# Patient Record
Sex: Male | Born: 1955 | Race: Black or African American | Hispanic: No | Marital: Single | Smoking: Never smoker
Health system: Southern US, Community
[De-identification: ages and names within clinical notes are randomized; demographics above are authoritative.]

## PROBLEM LIST (undated history)

## (undated) DIAGNOSIS — I10 Essential (primary) hypertension: Secondary | ICD-10-CM

## (undated) DIAGNOSIS — K4090 Unilateral inguinal hernia, without obstruction or gangrene, not specified as recurrent: Secondary | ICD-10-CM

## (undated) DIAGNOSIS — E876 Hypokalemia: Secondary | ICD-10-CM

## (undated) DIAGNOSIS — E7800 Pure hypercholesterolemia, unspecified: Secondary | ICD-10-CM

## (undated) DIAGNOSIS — N529 Male erectile dysfunction, unspecified: Secondary | ICD-10-CM

## (undated) DIAGNOSIS — N4340 Spermatocele of epididymis, unspecified: Secondary | ICD-10-CM

## (undated) DIAGNOSIS — J45909 Unspecified asthma, uncomplicated: Secondary | ICD-10-CM

## (undated) DIAGNOSIS — C801 Malignant (primary) neoplasm, unspecified: Secondary | ICD-10-CM

## (undated) DIAGNOSIS — N1370 Vesicoureteral-reflux, unspecified: Secondary | ICD-10-CM

## (undated) DIAGNOSIS — T7840XA Allergy, unspecified, initial encounter: Secondary | ICD-10-CM

## (undated) HISTORY — PX: HERNIA REPAIR: SHX51

## (undated) HISTORY — DX: Unilateral inguinal hernia, without obstruction or gangrene, not specified as recurrent: K40.90

## (undated) HISTORY — DX: Allergy, unspecified, initial encounter: T78.40XA

## (undated) HISTORY — DX: Hypokalemia: E87.6

## (undated) HISTORY — DX: Essential (primary) hypertension: I10

## (undated) HISTORY — DX: Unspecified asthma, uncomplicated: J45.909

## (undated) HISTORY — DX: Male erectile dysfunction, unspecified: N52.9

## (undated) HISTORY — DX: Spermatocele of epididymis, unspecified: N43.40

## (undated) HISTORY — DX: Pure hypercholesterolemia, unspecified: E78.00

---

## 2000-07-16 ENCOUNTER — Emergency Department (HOSPITAL_COMMUNITY): Admission: EM | Admit: 2000-07-16 | Discharge: 2000-07-17 | Payer: Self-pay | Admitting: Emergency Medicine

## 2000-07-17 ENCOUNTER — Encounter: Payer: Self-pay | Admitting: Emergency Medicine

## 2001-10-16 HISTORY — PX: UPPER GI ENDOSCOPY: SHX6162

## 2001-10-16 HISTORY — PX: ESOPHAGEAL DILATION: SHX303

## 2004-12-05 ENCOUNTER — Emergency Department (HOSPITAL_COMMUNITY): Admission: EM | Admit: 2004-12-05 | Discharge: 2004-12-05 | Payer: Self-pay | Admitting: Emergency Medicine

## 2006-08-16 ENCOUNTER — Ambulatory Visit (HOSPITAL_COMMUNITY): Admission: RE | Admit: 2006-08-16 | Discharge: 2006-08-16 | Payer: Self-pay | Admitting: Family Medicine

## 2006-10-31 ENCOUNTER — Emergency Department (HOSPITAL_COMMUNITY): Admission: EM | Admit: 2006-10-31 | Discharge: 2006-10-31 | Payer: Self-pay | Admitting: Emergency Medicine

## 2007-01-09 ENCOUNTER — Ambulatory Visit: Payer: Self-pay | Admitting: Internal Medicine

## 2007-01-09 ENCOUNTER — Inpatient Hospital Stay (HOSPITAL_COMMUNITY): Admission: RE | Admit: 2007-01-09 | Discharge: 2007-01-11 | Payer: Self-pay | Admitting: Internal Medicine

## 2007-01-15 ENCOUNTER — Ambulatory Visit: Payer: Self-pay | Admitting: Internal Medicine

## 2007-02-13 ENCOUNTER — Ambulatory Visit: Payer: Self-pay | Admitting: Internal Medicine

## 2007-02-26 ENCOUNTER — Ambulatory Visit: Payer: Self-pay | Admitting: Internal Medicine

## 2007-02-26 ENCOUNTER — Encounter: Payer: Self-pay | Admitting: Internal Medicine

## 2007-07-09 ENCOUNTER — Encounter: Admission: RE | Admit: 2007-07-09 | Discharge: 2007-07-09 | Payer: Self-pay | Admitting: Otolaryngology

## 2007-07-15 ENCOUNTER — Encounter (INDEPENDENT_AMBULATORY_CARE_PROVIDER_SITE_OTHER): Payer: Self-pay | Admitting: Otolaryngology

## 2007-07-15 ENCOUNTER — Ambulatory Visit (HOSPITAL_BASED_OUTPATIENT_CLINIC_OR_DEPARTMENT_OTHER): Admission: RE | Admit: 2007-07-15 | Discharge: 2007-07-15 | Payer: Self-pay | Admitting: Otolaryngology

## 2011-02-28 NOTE — Assessment & Plan Note (Signed)
[facility]                         GASTROENTEROLOGY OFFICE NOTE   FINLAY, GODBEE                     MRN:          [id number]  DATE:02/13/2007                            DOB:          [birth year]    Mr. [name] is a 55 year old gentleman who was recently hospitalized  after a food impaction in the esophagus with post procedure hypoxia,  possible aspiration and possible injury to the esophagus. He had a giant  piece of meat impacted in the esophagus during his emergency admission  on January 09, 2007. The impaction was removed. A similar episode happened  in 2001. The food was removed but the patient never returned for  followup. Since the food impaction was removed, the patient as been  chewing quite carefully and has had only occasionally problems with  swallowing liquids as well as solids. Barium swallow study before the  discharge on January 11, 2007 showed actually a normal esophagus, no  evidence of achalasia, no stricture. The barium tablet passed through  the esophagus without difficulty. There was a small hiatal hernia, no  tertiary contractions. The patient had a brief episode of dysphagia to  [place] fries several days ago. He flushed it down with a liquid but he  is interested in proceeding with dilatation which could not be done  during the emergency episode because of tearing of his esophagus and  maceration due to food impaction.   MEDICATIONS:  1. Potassium 10 mEq a day.  2. HCTZ 25 mg 1/2 tablet daily.   PHYSICAL EXAMINATION:  VITAL SIGNS:  Blood pressure 140/92, pulse 80 and  weight 175 pounds.  GENERAL:  He was alert and oriented in no distress. Voice was normal.  HEENT:  Oral cavity was normal.  NECK:  Supple with no adenopathy.  LUNGS:  Clear to auscultation. No wheezes or rales.  COR:  Quiet precordium, normal S1, normal S2.  ABDOMEN:  Soft, nontender with normal active bowel sounds.  RECTAL:  Not done.   IMPRESSION:   A 55 year old gentleman status post food impaction in the  distal esophagus. He was not dilated at the time of disimpaction. He had  some residual dysphagia mostly to solids possibly due to liquids. The  radiographic study of his esophagus on discharge did not show any  stricture or motility disorder. It also did not suggest achalasia.   PLAN:  1. Upper endoscopy with esophageal dilatation.  2. Consider Botox injection if we find hypertensive esophageal      sphincter.     [name]. Juanda Chance, MD  Electronically Signed    DMB/MedQ  DD: 02/13/2007  DT: 02/13/2007  Job #: [id number]   cc:   L. Lupe Carney, M.D.

## 2011-02-28 NOTE — Op Note (Signed)
[name], [name]            ACCOUNT NO.:  [id number]   MEDICAL RECORD NO.:  [id number]          PATIENT TYPE:  AMB   LOCATION:  DSC                          FACILITY:  [facility]   PHYSICIAN:  Karol T. Lazarus Salines, M.D. DATE OF BIRTH:  Jun 29, 1956   DATE OF PROCEDURE:  DATE OF DISCHARGE:                               OPERATIVE REPORT   PREOPERATIVE DIAGNOSES:  1. Bilateral polypoid chronic pansinusitis.  2. Nasal septal deviation.  3. Hypertrophic inferior turbinates with obstruction.   POSTOPERATIVE DIAGNOSES:  1. Bilateral polypoid chronic pansinusitis.  2. Nasal septal deviation.  3. Hypertrophic inferior turbinates with obstruction.   PROCEDURES PERFORMED:  1. Complete bilateral endoscopic ethmoidectomy.  2. Bilateral endoscopic antrostomy.  3. Bilateral endoscopic sphenoidotomy.  4. Bilateral endoscopic frontal sinusotomy.  5. Nasal septoplasty.  6. Bilateral submucous resection inferior turbinates.   SURGEON:  Gloris Manchester. Lazarus Salines, M.D.   ANESTHESIA:  General orotracheal.   ESTIMATED BLOOD LOSS:  Less than 25 mL.   COMPLICATIONS:  None.   FINDINGS:  1. Large polyps in middle meatus on both sides.  2. Polypoid changes in the mucosa diffusely.  3. No active infection.  4. No evidence of violation of the periorbita/lamina papyracea or the      fovea ethmoidalis.   PROCEDURE:  With the patient in a comfortable supine position, general  orotracheal anesthesia was induced without difficulty.  At an  appropriate level, the patient was placed in a slight head elevated  beach chair position.  A saline-moistened throat pack was placed.  Nasal vibrissae were trimmed.  Cocaine crystals, 200 mg total, were  applied on cotton carriers to the anterior ethmoid and sphenopalatine  ganglion regions on both sides.  Cocaine solution, 160 mg total, was  applied on 0.5 x 3-inch cottonoids on both sides of the septum.  Finally, 1% Xylocaine with 1:100,000 epinephrine, 10 mL total, was  infiltrated into the anterior floor of the nose on both sides, into the  membranous columella on both sides, into the nasal spine region, and  into the submucoperichondrial plane of the septum on both sides.  Several minutes were allowed for this to take effect.  Sterile  preparation and draping of  the mid face was accomplished in the  standard fashion.   The materials were removed from the nose and observed to be intact and  correct in number.  The findings were as described above.  A small  anterior left floor incision was generated and the floor tunnel elevated  posteriorly and carried medially to the vomer and brought forward.  On  the right side, a hemitransfixing incision was executed and carried down  to a floor incision.  The floor tunnel was elevated in the standard  fashion and carried medially and brought forward.  The  submucoperichondrial plane of the septum was elevated.  It was quite  adherent to the anterior inferior septum, probably consistent with  previous trauma.  It was elevated with a small perforation in this area,  but the perichondrium was preserved.  Working farther back, the  submucoperiosteal plane of the perpendicular plate was elevated up to  the dorsum and brought down to the floor of the nose and communicated  with the floor tunnel and this dissection was brought forward.   The chondroethmoidal junction was identified and opened with a Print production planner.  The opposite submucoperiosteal plane of the septum was  elevated.  The superior perpendicular plate was lysed with an open  Jansen-Middleton forceps and the anterior inferior portion of the  perpendicular plate was rocked free and delivered.  The dissection was  carried out under direct vision over the prominent chondroethmoid spur,  which was then rocked free and delivered.  Additional bony spicules as  they were deflected were also removed.   A 1.5-mm strip of the inferior quadrangular cartilage was  submucosally  resected and delivered.  This allowed the septum to trap door into the  midline, but otherwise the septum was in satisfactory position and  shape.  It was secured to the nasal spine with a figure-of-eight 4-0 PDS  suture.  The septal tunnels were carefully suctioned free.  The anterior  incisions were closed with interrupted 4-0 chromic suture.   After completing the nasoseptoplasty, under direct vision, a 25-gauge  spinal needle was used to infiltrate 1% Xylocaine with 1:100,000  epinephrine into the middle turbinate on each side and into the lateral  wall of the nose in the middle meatus, 6 mL total were used.  Additional  cocaine solution, 160 mg total, was applied on 0.5 x 3-inch cottonoids  and placed into the middle meatus and between the middle turbinate and  the septum.  Several additional minutes were allowed for hemostasis and  topical anesthesia to take effect.  The endoscopic instrumentation was  prepared.   Once again, the materials were removed from the nose and observed to be  intact and correct in number.  The findings were as described above.  The 0 degree nasal endoscope was introduced on the right side and again,  the findings were as described above.  The power debrider was used to  debulk the very bulky polyp in the middle meatus.  The specimen was  separated right from left.  After removing the large polyp, the uncinate  process was identified and incised at its base with a sickle knife.  It  was amputated inferiorly and then removed with the power debrider.  The  antrostomy was visible and was enlarged in a posterior direction with a  Gruenwald forceps and in the anterior direction with a backbiting  forceps.  The edges were cleaned with a power debrider.  Using the  antrostomy as a landmark for the laminar papyracea, and attempting to  preserve the horizontal lamella of the middle turbinate, the bulla  ethmoidalis was entered with a punch forceps  and then removed with a  debrider.  The vertical lamella of the middle turbinate was penetrated  low and medially with the punch forceps and dividing partitions were  carefully removed from inferiorly upward and bone and mucosa were then  removed with a debrider.  The InstaTrak apparatus had been oriented at  the beginning of the case and was at this point used to identify the  posterior ethmoid cells in the anterior face of the sphenoid.  The  sphenoid face was penetrated with a suction tip and then opened widely  with a power debrider and Hajek forceps.  Working from this point  forward, additional partitions up against the fovea ethmoidalis were  removed to clean the posterior and mid ethmoid block thoroughly.  The instruments were switched to a 30-degree allowing better  visualization of the anterior ethmoids/agger nasi region.  Cells were  broken down with a punched forceps and then removed with a punched  forceps and/or with a debrider.  Finally, an opening was identified,  which upon enlarging it was clearly the frontal sinus.  The InstaTrak  orientation identified such.  The curved olive tip suction was able to  be passed well up into the sinus, identifying the frontal sinus more  definitively.  Direct visualization was also possible after clearing  away the bony partition and removing loose mucosal shards.  At this  point, a total sinusectomy was felt to have been accomplished on the  right side.  Hemostasis was spontaneous.  The middle turbinate was  somewhat floppy but still had decent support.   Attention was turned to the left side, once again a large polyp was  debrided from the middle meatus.  The uncinate process was identified,  incised, and removed with a debrider.  The bulla ethmoidalis was  identified, opened and reduced with a debrider.  The antrostomy was  identified and opened anteriorly and posteriorly and the cut edges were  cleaned with a debrider.  The  vertical lamella of the middle turbinate  was identified and penetrated low and medial and the posterior ethmoid  cells were entered and debrided in the standard fashion.  Finally, the  space of the sphenoid was identified using the InstaTrak and was opened  and enlarged using the debrider and the Hajek forceps.   Entering the sphenoethmoidal recess medial to the middle turbinate, some  redundant mucosa was identified and this was removed with a power  debrider to allow wide open sphenoidotomy.   Working towards the anterior ethmoid cells and the agger nasi.  The 30-  degree nasal endoscope was used and once again cells were broken down  and debrided.  A rather small frontal entrance was identified and  designated with the InstaTrak and it was opened up against the bony  beak.  Bony chips and mucosal shards were removed with the debrider.  This completed the left sinusotomy.   Returning to the right side and working medial to the middle turbinate  into the sphenoethmoidal recess, a small amount of redundant mucosa was  removed with a power debrider.  At this point, both sinus cavities were  inspected and there was no evidence of spinal fluid leak and no evidence  of violation of the lamina papyracea on either side.   Just before completing the sinus procedure, the inferior turbinates were  infiltrated with 4 mL of 1% Xylocaine with 1:100,000 epinephrine.  Several minutes were allowed for this to take effect.   Beginning on the right side, the anterior hood of the inferior turbinate  was sharply incised just behind the nasal valve.  The medial mucosa of  the middle turbinate was incised in an anterior upsloping fashion and a  laterally based flap was developed.  The turbinate was then fractured.  Using angled turbinate scissors, the turbinate bone and lateral mucosa  were submucosally resected in a posterior downsloping fashion taking  virtually all of the anterior pole and leaving  virtually all of the  posterior pole.  Bony spicules were removed for more rapid healing. The  bulbous posterior pole and the cut mucosal edges were suctioned and  coagulated for hemostasis.  The turbinate was out fractured and this  side was completed.  The left side was done  in identical fashion.   At this point, 0.040 reinforced Silastic splints were placed against the  septum on both sides, tall enough to prevent the middle turbinates from  adhering to the septum.  These were secured to the septum with a 3-0  Ethilon anteriorly.   Triple thickness Telfa packs with a soaked tag and neosporin  impregnation were placed up into the ethmoid block on both sides and  nontagged larger triple thickness Telfa packs were placed low in the  nose between the turbinate and the septum for hemostasis.  At this  point, the procedure was completed.  The pharynx was suctioned clean and  the throat pack was removed.  The patient was returned to anesthesia,  awakened, extubated, and transferred to recovery in stable condition.   COMMENT:  A 55 year old black male with chronic polypoid pansinusitis  and asthma, no aspirin sensitivity, failing multiple courses of  antibiotics and steroids with the indication for today's procedure.  Anticipated routine  postoperative recovery with attention to ice, elevation, analgesia,  antibiosis, prednisone taper.  We will bring him back in the office in 1  day for pack removal and in 10 days for septal splint removal.  Given  low anticipated risk of postanesthetic or postsurgical complications,  feel an outpatient venue is appropriate.      Gloris Manchester. Lazarus Salines, M.D.  Electronically Signed     KTW/MEDQ  D:  07/15/2007  T:  07/15/2007  Job:  [id number]   cc:   Zola Button T. Lazarus Salines, M.D.  Elsworth Soho, M.D.

## 2011-03-03 NOTE — Discharge Summary (Signed)
NAMEKNOX, CERVI NO.:  [id number]   MEDICAL RECORD NO.:  [id number]          PATIENT TYPE:  INP   LOCATION:  5017                         FACILITY:  [facility]   PHYSICIAN:  Ireland Virrueta doctor         DATE OF BIRTH:  16-Jan-1956   DATE OF ADMISSION:  01/09/2007  DATE OF DISCHARGE:  01/11/2007                               DISCHARGE SUMMARY   ADMITTING DIAGNOSIS:  1. Food impaction in 2001.  2. Asthma.  3. Hypertension  4. Acute respiratory distress following removal of giant food      impaction, question sedation -induced hypoxemia, question      aspiration.      a.     Rule out Boerhaave syndrome, rule out esophageal tear       secondary to traumatic removal of food impaction, rule out       esophageal stricture.  5. Question alcohol abuse.  6. Rule out aspiration pneumonia.   DISCHARGE DIAGNOSES:  1. Status post removal of giant food impaction.  2. Hypoxia occurred following the removal of the steak.  3. Hypoxia, probable aspiration during the endoscopic procedure.  4. Question of portal venous gas.   Initial CT showed question of portal venous gas and clear lungs.  Chest  CT did show some scattered airspace disease in the left upper and lower  lobes.  Followup chest CT showed improving left lung airspace disease.  The patient may have pneumonitis but not suspected of having pneumonia  as he is afebrile and has had no fevers or white blood cell count  elevation.   PROCEDURES:  Upper endoscopy with removal of food impaction by Dr. Lina Sar on January 09, 2007.   CONSULTATIONS:  Dr. Tyson Alias from critical care medicine.   BRIEF HISTORY:  [name] is a 56 year old African American  gentleman who has a history of food impaction in 2001.  He is not  chronically taking any proton pump inhibitors and generally does not  suffer from dysphagia, except for on occasion when he chokes on large  pieces of food, especially meats.  This has happened only a  few times  over several years.  The patient was eating early in the morning of  January 09, 2007, and swallowed a piece of steak which became impacted and  the patient was unable to swallow any other material after this.  He  developed dry heaves and went to Urgent Care.  He was sent by urgent  care to see Dr. Juanda Chance at the endoscopy unit at [facility].   At endoscopy, a very large 10 x 4-cm piece of steak was impacted in what  appeared to be a stricture and ulcerated distal esophagus.  This was  removed with a Designer, jewellery.  And as soon as this was extracted, the  patient's oxygen saturation dropped to 81% and stayed down despite the  use of 100% face mask oxygen.  There was a concern that he had aspirated  and as he was unstable from a respiratory standpoint, he was admitted to  the ICU  and critical care  input was sought.   LABORATORY:  Initial pH was 7.49 with pCO2 of 30.9, pO2 of 119, bicarb  of 23.6, tCO2 of 25.  White blood cell count was 8.1, on recheck it was  5.5.  Hemoglobin went from 14.2 to 12.5.  Hematocrit went from 41.6 to  36.6.  MCV 94, platelet count ranging 187 to 235.  PT 13.4, INR 1, PTT  25.  Sodium 139, potassium 3.5, chloride 108, CO2 26, glucose 125, BUN  11, creatinine 0.95.  Total bilirubin was 0.7, alkaline phosphatase 63,  AST 19, ALT 15.  Albumin 3.5.  Amylase 118, lipase 28.  Calcium 8.6.  TSH 0.811.  The hemoglobin at discharge was 12.8 with hematocrit of  37.7.   IMAGING STUDIES:  Initial chest x-ray was negative for free air or  pneumomediastinum.  Lungs were clear.  Question of portal venous gas.  CT of chest, abdomen, and pelvis of January 09, 2007, showed scattered air  space opacities somewhat nodular in nature in the left upper and left  lower lobe, likely representing aspiration pneumonitis or pneumonia.  Portal venous gas was noted with no clear upper abdominal cause.  Followup two-view chest of January 11, 2007, showed improving left  lung  airspace disease and the lucencies in the right upper quadrant  previously representing the portal venous gas were not seen on the  followup chest x-ray.  A follow-up chest x-ray failed to confirm  presence of portal venous gas seen previously on first chest x-ray and  chest and abdominal CT scan.  An esophagram was obtained and this showed  a small hiatal hernia, mild gastroesophageal reflux disease, and did not  show any stricture.   HOSPITAL COURSE:  The patient was admitted overnight to the ICU and  closely monitored.  Within a couple of hours of the incident involving  hypoxemia in the endoscopy unit, the patient was speaking clearly, not  coughing, and not in any kind of respiratory distress.  He spent an  uneventful night in the critical care unit and was transferred to the  floor the following day.   Initially, the chest x-ray was negative but a chest CT scan done to rule  out any possible tear of the esophagus and also to workup the question  of portal venous gas seen on the initial chest x-ray, was notable for  lucencies they felt were consistent with portal venous gas and some  developing left lung airspace disease.  The patient had been empirically  started on Diflucan and Zosyn.  He did not have any leukocytosis on  repeat labs.  He was afebrile.  He was not coughing.  He was not  hypoxemic, and ultimately it was felt that the patient could stop the  antibiotics.   The question of portal venous gas was worked up with the chest CT and  followup chest x-ray.  The followup chest x-ray failed to confirm the  presence of this gas.  In any event, the patient was in no way toxic or  sick enough during his hospital stay to support a diagnosis of portal  venous gas, and this was probably a transient finding, perhaps related  to the endoscopy.  There was no evidence for any esophageal perforation  or extravasation of contrast into the mediastinum.  The patient's diet was  advanced from clear liquids through a mechanical  soft diet.  He was admonished on at least a couple of occasions to be  very careful and chew  his food well, and to avoid swallowing large  pieces of meat or breads.   The patient was discharged in stable and much improved condition to  home.  He does have an appointment to see Dr. Juanda Chance back in the office  for a visit on February 13, 2007.  At that time it can be decided whether  or not he should have repeat endoscopy.   The patient did undergo an upper GI study on the day of his discharge  and this failed to confirm a stricture.  It did show hiatal hernia and  some reflux disease.   MEDICATIONS AT DISCHARGE:  1. Omeprazole 20 mg once daily.  2. Hydrochlorothiazide 12.5 mg once daily.   The patient was advised that if he had fever, felt particularly unwell,  or was having extensive cough, or shortness of breath that he should  call our office so that he could get a prescription for antibiotics but  at this point it is not felt that he has a pneumonia and it is not felt  that he requires an oral course of antibiotics.      Ruben Moccasin, PA-C    ______________________________  Iyania Denne doctor    SG/MEDQ  D:  01/11/2007  T:  01/11/2007  Job:  [id number]   cc:   Hedwig Morton. Juanda Chance, MD  L. Lupe Carney, M.D.

## 2011-03-03 NOTE — H&P (Signed)
NAMEHEATH, [name] NO.:  [id number]   MEDICAL RECORD NO.:  [id number]          PATIENT TYPE:  INP   LOCATION:  2113                         FACILITY:  [facility]   PHYSICIAN:  Hedwig Morton. Juanda Chance, MD     DATE OF BIRTH:  [birth year]   DATE OF ADMISSION:  01/09/2007  DATE OF DISCHARGE:                              HISTORY & PHYSICAL   REASON FOR ADMISSION:  Hypoxemia and possible aspiration following EGD  with removal of food impaction earlier today.   HISTORY OF PRESENT ILLNESS:  Mr. [name] is a 55 year old African  American male.  In 2001 he had a food impaction which required  endoscopic disimpaction.  He is not on any PPI. He does not generally  have a lot of dysphasia but does suffer this on occasion.  Last evening  he was out partying and he had probably about 4 beers.  He picked up  some take out food which included stake, on the way home.  At 4 o'clock  this morning he ate the steak.  The first bite went down fine, but the  second bite got impacted and he became unable to swallow other material  after this.  He did a little bit of vomiting but mostly dry heaving.  He  went to urgent care and the doctor there contacted Dr. Juanda Chance for  assistance.  He came over for upper endoscopy to the [facility].   At endoscopy a very large steak was impacted in a strictured and  ulcerated distal esophagus.  She was able to retrieve this piece of  steak which measured approximately 10 x 4 cm with a Designer, jewellery.  Immediately after this was extracted the patient's oxygen saturation  dropped to 81% and stayed down despite use of 100% face mask oxygen.  Critical care was contacted and the patient was admitted to ICU for  observation and rule out of aspiration pneumonia.   ALLERGIES:  None.   MEDICATIONS:  Hydrochlorothiazide 12.5 mg daily.   PAST MEDICAL HISTORY:  Food impaction in 2001.  Asthma and also  hypertension.   SOCIAL HISTORY:  The  patient is single.  He lives in [place].  He  does not smoke. He drinks he says about a six-pack of beer a week but he  did have four beers yesterday evening.   FAMILY HISTORY:  Mother had diabetes mellitus type 2, thyroid disease,  adrenal disease, cataracts and ultimately she died of a brain tumor.  A  sister has had osteoarthritis and bone spurs and a ruptured disk and  rotator cuff tear.   REVIEW OF SYSTEMS:  UROLOGIC:  No prostate symptoms.  No dysuria, no  frequency.  NEUROLOGIC: No headaches.  No dizziness, no double vision.  PULMONARY:  Occasionally requires use of inhaler.  CARDIAC:  Does not  have palpitations, chest pain or lower extremity edema.  Pulses are  pending.  DERMATOLOGIC:  No history of skin cancer.  No rash.  No  itching.  Otherwise the full review of systems is negative for this  patient.   LABORATORY:  White  blood cell count 8.1.  Hemoglobin 14.2, hematocrit  41.6.  MCV 94.  Platelets 235,000.  PT 13.4, INR 1.0, PTT 25.  Sodium  139, potassium 3.5.  Chloride 108, CO2 26.  BUN 11, creatinine 0.9.  Glucose 125.  Total bilirubin, alkaline phosphatase, AST, ALT, albumin,  calcium all within normal limits.  The GFR is greater than 60.   Chest x-ray is pending.   PHYSICAL EXAMINATION:  VITAL SIGNS: Blood pressure 154/106, pulse 101,  oxygen saturation 94%.  Respirations 23.  GENERAL:  The patient is a well-appearing, pleasant white male. He  denies any discomfort or pain.  HEENT: Exam sclerae anicteric.  Conjunctivae is pink.  Oropharynx is  moist and clear.  NECK: Notable for no crepitations, no mass, no thyromegaly.  PULMONARY:  Positive for rales and expiratory wheezes bilaterally.  CARDIOVASCULAR:  There is a rapid S1-S2 with no murmurs, rubs or  gallops.  ABDOMEN:  Soft, nontender, nondistended, obese, no hepatosplenomegaly,  no bruits.  RECTAL:  GU:  Deferred.  EXTREMITIES:  No cyanosis, clubbing or edema.  NEUROLOGIC:  He is alert and oriented x3,  pleasant and cooperative.  PSYCHIATRIC:  Does not appear depressed.   IMPRESSION:  1. Acute respiratory distress following removal of a giant food      impaction.  Question sedation hypoxia, question aspiration.  Rule      out Boerhaave syndrome which is tearing of the esophagus secondary      to retching, rule out esophageal tear secondary to the traumatic      removal of food impaction.  2. Esophageal stricture.  This will require attention in the long-term      with attempts at dilation of the esophagus in a few weeks.  3. Question of alcohol abuse.  The patient currently denies but he      certainly did consume a fair amount of beer last night.   PLAN:  Critical care has been called for assistance.  We will plan to  get chest x-ray.  Continue oxygen.  CT of chest ordered, will begin  empiric antibiotics for coverage of aspiration pneumonia.      Jennye Moccasin, PA-C      Hedwig Morton. Juanda Chance, MD  Electronically Signed    SG/MEDQ  D:  01/09/2007  T:  01/10/2007  Job:  [id number]   cc:   L. Lupe Carney, M.D.

## 2011-03-03 NOTE — Procedures (Signed)
[place]  Patient:    BOLIVAR, KORANDA                     MRN: [id number] Adm. Date:  [id number] Attending:  Sandi Raveling CC:         Hedwig Morton. Juanda Chance, M.D. [facility]   Procedure Report  PROCEDURE:  Upper endoscopy and removal of foreign body.  INDICATION:  This 55 year old black male presented to the emergency room with acute food impaction which occurred about eight hours previously while eating meat.  The patient was unable to swallow his saliva and was expectorating all secretions.  Attempt for barium swallow in the emergency room was unsuccessful because of complete obstruction.  The patient was undergoing upper endoscopy for the attempt for removal of foreign.  INSTRUMENTS:  Fujinon single-channel endoscope.  PREMEDICATION:  Versed 10 mg IV, Demerol 100 mg IV.  DESCRIPTION OF PROCEDURE:  The Fujinon single-channel endoscope passed directly through the posterior pharynx into the esophagus.  The patient was monitored by pulse oximeter.  His oxygen saturations were normal.  The oral tube was inserted with help of 42 [place] Maloney dilator.  The endoscope was advanced into the esophagus which was full of secretions.  A large amount of liquid residual was aspirated from the mid and proximal esophagus.  There was a large food impaction of the distal esophagus which was firmly impacted in the distal esophagus and could not be pushed through.  A basket snare was placed over the impaction and the impaction was dislodged and put slightly back, but it was too large to go through the over tube.  It was piecemealed with a red tooth and finally endoscope was able to slide by the fragmented impaction and into the esophagus.  The mucosa of the distal esophagus appeared macerated with large runny erosions secondary to previous reflux esophagitis.  There was a mild stricture which was bleeding from this orifice in the distal esophagus.  There was also  edema in the distal esophagus.  Stomach was insufflated with air and showed a normal gastric mucosa, pyloric outlet and duodenum.  Endoscope was retroflexed and there was some bleeding from the GE junction as a result of the food impaction and maceration.  The patient tolerated the procedure well.  IMPRESSION: 1. Esophageal impaction, mid-impaction, status post disimpaction. 2. Esophagitis. 3. Esophageal stricture.  PLAN:  Protonix 40 mg q.d.  Liquid diet for 24 hours.  Office visit in three weeks.  The patient will need upper endoscopy and redilatation of the stricture when the esophagitis heals up. DD:  07/17/00 TD:  07/17/00 Job: [id number]

## 2011-07-27 LAB — BASIC METABOLIC PANEL
BUN: 15
CO2: 27
Calcium: 9
Chloride: 106
Creatinine, Ser: 1.05
GFR calc Af Amer: >60
GFR calc non Af Amer: >60
Glucose, Bld: 118 — ABNORMAL HIGH
Potassium: 3.9
Sodium: 139

## 2011-07-27 LAB — POCT HEMOGLOBIN-HEMACUE
Hemoglobin: 13.7
Operator id: 116011

## 2012-04-04 ENCOUNTER — Other Ambulatory Visit: Payer: Self-pay | Admitting: Family Medicine

## 2012-04-04 ENCOUNTER — Ambulatory Visit
Admission: RE | Admit: 2012-04-04 | Discharge: 2012-04-04 | Disposition: A | Discharge status: Home / Self Care | Payer: Managed Care, Other (non HMO) | Source: Ambulatory Visit | Attending: Family Medicine | Admitting: Family Medicine

## 2012-04-04 DIAGNOSIS — M79609 Pain in unspecified limb: Secondary | ICD-10-CM

## 2012-05-01 ENCOUNTER — Encounter (INDEPENDENT_AMBULATORY_CARE_PROVIDER_SITE_OTHER): Payer: Self-pay | Admitting: General Surgery

## 2012-05-01 ENCOUNTER — Ambulatory Visit (INDEPENDENT_AMBULATORY_CARE_PROVIDER_SITE_OTHER): Payer: Managed Care, Other (non HMO) | Admitting: General Surgery

## 2012-05-01 VITALS — BP 164/100 | HR 71 | Temp 97.4°F | Resp 16 | Ht 66.0 in | Wt 173.2 lb

## 2012-05-01 DIAGNOSIS — K4090 Unilateral inguinal hernia, without obstruction or gangrene, not specified as recurrent: Secondary | ICD-10-CM

## 2012-05-01 NOTE — Progress Notes (Signed)
Patient ID: [name], male   DOB: 1956-05-27, 56 y.o.   MRN: [id number]  No chief complaint on file.   HPI [name] is a 56 y.o. male.  This patient is referred by Dr. Clovis Riley for evaluation of a right inguinal hernia. 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 He said he first noticed this after lifting a heavy box several months ago where he noticed some testicular pain but this resolved. About 2 months ago he first noticed a bulge in the area but he says since then it has been decreasing in size although he still notices a bulge in the area. He denies any symptoms from this currently and states that his bowels are okay without any obstructive symptoms such as nausea vomiting. He denies any blood in the stool or melena. HPI  Past Medical History  Diagnosis Date  . Hypertension   . Asthma   . Erectile dysfunction     No past surgical history on file.  No family history on file.  Social History History  Substance Use Topics  . Smoking status: Never Smoker   . Smokeless tobacco: Never Used  . Alcohol Use: 0.5 oz/week    1 drink(s) per week    Allergies  Allergen Reactions  . Lipitor (Atorvastatin) Other (See Comments)    Muscle aches    Current Outpatient Prescriptions  Medication Sig Dispense Refill  . albuterol (PROVENTIL) (2.5 MG/3ML) 0.083% nebulizer solution Take 2.5 mg by nebulization every 6 (six) hours as needed.      . cetirizine (ZYRTEC) 10 MG tablet Take 10 mg by mouth daily.      . fluticasone (FLOVENT HFA) 110 MCG/ACT inhaler Inhale 1 puff into the lungs 2 (two) times daily.      . hydrochlorothiazide (HYDRODIURIL) 25 MG tablet Take 25 mg by mouth daily.      . potassium chloride (K-DUR) 10 MEQ tablet Take 10 mEq by mouth 2 (two) times daily.      . rosuvastatin (CRESTOR) 10 MG tablet Take 10 mg by mouth daily.      . sildenafil (VIAGRA) 100 MG tablet Take 100 mg by mouth daily as needed.        Review of  Systems Review of Systems All other review of systems negative or noncontributory except as stated in the HPI  Blood pressure 164/100, pulse 71, temperature 97.4 F (36.3 C), temperature source Temporal, resp. rate 16, height 5\' 6"  (1.676 m), weight 173 lb 3.2 oz (78.563 kg).  Physical Exam Physical Exam Physical Exam  Vitals reviewed. Constitutional: He is oriented to person, place, and time. He appears well-developed and well-nourished. No distress.  HENT:  Head: Normocephalic and atraumatic.  Mouth/Throat: No oropharyngeal exudate.  Eyes: Conjunctivae and EOM are normal. Pupils are equal, round, and reactive to light. Right eye exhibits no discharge. Left eye exhibits no discharge. No scleral icterus.  Neck: Normal range of motion. No tracheal deviation present.  Cardiovascular: Normal rate, regular rhythm and normal heart sounds.   Pulmonary/Chest: Effort normal and breath sounds normal. No stridor. No respiratory distress. He has no wheezes. He has no rales. He exhibits no tenderness.  Abdominal: Soft. Bowel sounds are normal. He exhibits no distension and no mass. There is no tenderness. There is no rebound and no guarding. He does have a small reducible right inguinal hernia and a noticeable bulge but again this is reducible. I do not appreciate any hernia on  the left. I do not appreciate any testicular problems or masses Musculoskeletal: Normal range of motion. He exhibits no edema and no tenderness.  Neurological: He is alert and oriented to person, place, and time.  Skin: Skin is warm and dry. No rash noted. He is not diaphoretic. No erythema. No pallor.  Psychiatric: He has a normal mood and affect. His behavior is normal. Judgment and thought content normal.    Data Reviewed   Assessment    Reducible right inguinal hernia I agree that he does have a reducible right inguinal hernia on exam and by history. I discussed with him the options for watchful waiting versus surgical  repair and I have recommended surgical repair. We discussed the surgical options of laparoscopic and open repair. Because he is currently asymptomatic from this, he would like to discuss repair with his supervisor and his job so he can schedule time for elective surgery. I discussed with him the signs and symptoms of incarceration and strangulation and recommended that if indeed these occur he presented to the emergency room for immediate evaluation.    Plan    I gave him some information to review about inguinal hernias and he will work with his work in order to schedule a time for elective right inguinal hernia repair with mesh. He will call me back when he would like to schedule this.       Lodema Pilot DAVID 05/01/2012, 2:06 PM

## 2012-08-22 ENCOUNTER — Encounter (INDEPENDENT_AMBULATORY_CARE_PROVIDER_SITE_OTHER): Payer: Self-pay | Admitting: General Surgery

## 2012-08-22 ENCOUNTER — Ambulatory Visit (INDEPENDENT_AMBULATORY_CARE_PROVIDER_SITE_OTHER): Payer: Managed Care, Other (non HMO) | Admitting: General Surgery

## 2012-08-22 VITALS — BP 158/82 | HR 72 | Temp 97.0°F | Resp 12 | Ht 66.0 in | Wt 173.6 lb

## 2012-08-22 DIAGNOSIS — K4090 Unilateral inguinal hernia, without obstruction or gangrene, not specified as recurrent: Secondary | ICD-10-CM

## 2012-08-22 NOTE — Progress Notes (Signed)
Patient ID: [name], male   DOB: [birth year], 56 y.o.   MRN: [id number]  Chief Complaint  Patient presents with  . Pre-op Exam    reck hernia / ready to schedule Sx    HPI [name] is a 56 y.o. male. This patient is known to me for prior evaluation of amildly symptomatic right inguinal hernia. He does do some lifting and driving for work and it occasionally causes him discomfort. It is not currently bothering him but he is now working with his work to arrange for some time off to undergo surgery. He says it has not really changed much since our last visit. he has no obstructive symptoms.HPI  Past Medical History  Diagnosis Date  . Hypertension   . Asthma   . Erectile dysfunction   . Spermatocele   . Impotence of organic origin   . Pure hypercholesterolemia   . Allergy   . Hypopotassemia   . Inguinal hernia without mention of obstruction or gangrene, unilateral or unspecified, (not specified as recurrent)     History reviewed. No pertinent past surgical history.  History reviewed. No pertinent family history.  Social History History  Substance Use Topics  . Smoking status: Never Smoker   . Smokeless tobacco: Never Used  . Alcohol Use: 0.5 oz/week    1 drink(s) per week    Allergies  Allergen Reactions  . Lipitor (Atorvastatin) Other (See Comments)    Muscle aches    Current Outpatient Prescriptions  Medication Sig Dispense Refill  . albuterol (PROVENTIL) (2.5 MG/3ML) 0.083% nebulizer solution Take 2.5 mg by nebulization every 6 (six) hours as needed.      . cetirizine (ZYRTEC) 10 MG tablet Take 10 mg by mouth daily.      . fluticasone (FLOVENT HFA) 110 MCG/ACT inhaler Inhale 1 puff into the lungs 2 (two) times daily.      . hydrochlorothiazide (HYDRODIURIL) 25 MG tablet Take 25 mg by mouth daily.      Marland Kitchen KLOR-CON M10 10 MEQ tablet       . potassium chloride (K-DUR) 10 MEQ tablet Take 10 mEq by mouth 2 (two) times daily.      . sildenafil (VIAGRA) 100 MG  tablet Take 100 mg by mouth daily as needed.        Review of Systems Review of Systems All other review of systems negative or noncontributory except as stated in the HPI  Blood pressure 158/82, pulse 72, temperature 97 F (36.1 C), temperature source Temporal, resp. rate 12, height 5\' 6"  (1.676 m), weight 173 lb 9.6 oz (78.744 kg).  Physical Exam Physical Exam Physical Exam  Vitals reviewed. Constitutional: He is oriented to person, place, and time. He appears well-developed and well-nourished. No distress.  HENT:  Head: Normocephalic and atraumatic.  Mouth/Throat: No oropharyngeal exudate.  Eyes: Conjunctivae and EOM are normal. Pupils are equal, round, and reactive to light. Right eye exhibits no discharge. Left eye exhibits no discharge. No scleral icterus.  Neck: Normal range of motion. No tracheal deviation present.  Cardiovascular: Normal rate, regular rhythm and normal heart sounds.   Pulmonary/Chest: Effort normal and breath sounds normal. No stridor. No respiratory distress. He has no wheezes. He has no rales. He exhibits no tenderness.  Abdominal: Soft. Bowel sounds are normal. He exhibits no distension and no mass. There is no tenderness. There is no rebound and no guarding. reducible right inguinal hernia. No left inguinal hernia. Musculoskeletal: Normal range of motion. He exhibits no edema and  no tenderness.  Neurological: He is alert and oriented to person, place, and time.  Skin: Skin is warm and dry. No rash noted. He is not diaphoretic. No erythema. No pallor.  Psychiatric: He has a normal mood and affect. His behavior is normal. Judgment and thought content normal.    Data Reviewed   Assessment    Reducible right inguinal hernia We discussed the options of continued watchful waiting versus open or laparoscopic repair. We discussed the pros and cons of each and the risks and benefits of the procedure. We discussed the risks of infection, bleeding, pain,  scarring, recurrence, persistent symptoms, need for open surgery, injury to the testicle or vas deferens, and nerve injury and chronic pain. He expressed understanding and would like to proceed with laparoscopic right inguinal hernia repair with mesh    Plan    We will go ahead and set him up for a laparoscopic right inguinal area repaired with mesh when convenient.      Lodema Pilot DAVID 08/22/2012, 1:44 PM

## 2012-09-30 ENCOUNTER — Encounter (HOSPITAL_COMMUNITY): Payer: Self-pay

## 2012-10-01 ENCOUNTER — Encounter (HOSPITAL_COMMUNITY): Payer: Self-pay

## 2012-10-01 ENCOUNTER — Encounter (HOSPITAL_COMMUNITY)
Admission: RE | Admit: 2012-10-01 | Discharge: 2012-10-01 | Disposition: A | Discharge status: Home / Self Care | Payer: Managed Care, Other (non HMO) | Source: Ambulatory Visit | Attending: General Surgery | Admitting: General Surgery

## 2012-10-01 ENCOUNTER — Encounter (HOSPITAL_COMMUNITY)
Admission: RE | Admit: 2012-10-01 | Discharge: 2012-10-01 | Disposition: A | Discharge status: Home / Self Care | Payer: Managed Care, Other (non HMO) | Source: Ambulatory Visit | Attending: Anesthesiology | Admitting: Anesthesiology

## 2012-10-01 VITALS — BP 122/85 | HR 85 | Temp 98.7°F | Resp 20 | Ht 66.0 in | Wt 170.0 lb

## 2012-10-01 DIAGNOSIS — K4090 Unilateral inguinal hernia, without obstruction or gangrene, not specified as recurrent: Secondary | ICD-10-CM

## 2012-10-01 HISTORY — DX: Vesicoureteral-reflux, unspecified: N13.70

## 2012-10-01 HISTORY — DX: Malignant (primary) neoplasm, unspecified: C80.1

## 2012-10-01 LAB — CBC
HCT: 39 % (ref 39.0–52.0)
Hemoglobin: 13.8 g/dL (ref 13.0–17.0)
MCH: 31.6 pg (ref 26.0–34.0)
MCHC: 35.4 g/dL (ref 30.0–36.0)
MCV: 89.2 fL (ref 78.0–100.0)
Platelets: 228 10*3/uL (ref 150–400)
RBC: 4.37 MIL/uL (ref 4.22–5.81)
RDW: 12.8 % (ref 11.5–15.5)
WBC: 6.8 10*3/uL (ref 4.0–10.5)

## 2012-10-01 LAB — BASIC METABOLIC PANEL
BUN: 19 mg/dL (ref 6–23)
CO2: 26 mEq/L (ref 19–32)
Calcium: 9.5 mg/dL (ref 8.4–10.5)
Chloride: 101 mEq/L (ref 96–112)
Creatinine, Ser: 1.23 mg/dL (ref 0.50–1.35)
GFR calc Af Amer: 74 mL/min — ABNORMAL LOW (ref >90)
GFR calc non Af Amer: 64 mL/min — ABNORMAL LOW (ref >90)
Glucose, Bld: 96 mg/dL (ref 70–99)
Potassium: 3.3 mEq/L — ABNORMAL LOW (ref 3.5–5.1)
Sodium: 139 mEq/L (ref 135–145)

## 2012-10-01 LAB — SURGICAL PCR SCREEN
MRSA, PCR: POSITIVE — AB
Staphylococcus aureus: POSITIVE — AB

## 2012-10-01 NOTE — Pre-Procedure Instructions (Addendum)
20 [name]  10/01/2012   Your procedure is scheduled on:  10/10/12  Report to [facility].  Call this number if you have problems the morning of surgery: [phone]   Remember:   Do not eat food or drink:After Midnight.    Take these medicines the morning of surgery with A SIP OF WATER: inhalers , nebulizer if needed   Do not wear jewelry  Do not wear lotions, powders, or perfumes. You may not wear deodorant.  Do not shave 48 hours prior to surgery. Men may shave face and neck.  Do not bring valuables to the hospital.  Contacts, dentures or bridgework may not be worn into surgery.  Leave suitcase in the car. After surgery it may be brought to your room.  For patients admitted to the hospital, checkout time is 11:00 AM the day of discharge.   Patients discharged the day of surgery will not be allowed to drive home.  Name and phone number of your driver: sister veronica [phone]  Special Instructions: Shower using CHG 2 nights before surgery and the night before surgery.  If you shower the day of surgery use CHG.  Use special wash - you have one bottle of CHG for all showers.  You should use approximately 1/3 of the bottle for each shower.   Please read over the following fact sheets that you were given: Pain Booklet, Coughing and Deep Breathing, MRSA Information and Surgical Site Infection Prevention

## 2012-10-09 MED ORDER — CEFAZOLIN SODIUM-DEXTROSE 2-3 GM-% IV SOLR
2.0000 g | INTRAVENOUS | Status: AC
  Administered 2012-10-10: 2 g via INTRAVENOUS
  Filled 2012-10-09: qty 50

## 2012-10-10 ENCOUNTER — Encounter (HOSPITAL_COMMUNITY)
Admission: RE | Disposition: A | Discharge status: Home / Self Care | Payer: Self-pay | Source: Ambulatory Visit | Attending: General Surgery

## 2012-10-10 ENCOUNTER — Ambulatory Visit (HOSPITAL_COMMUNITY): Payer: Managed Care, Other (non HMO) | Admitting: Certified Registered"

## 2012-10-10 ENCOUNTER — Encounter (HOSPITAL_COMMUNITY): Payer: Self-pay | Admitting: Certified Registered"

## 2012-10-10 ENCOUNTER — Encounter (HOSPITAL_COMMUNITY): Payer: Self-pay | Admitting: Surgery

## 2012-10-10 ENCOUNTER — Ambulatory Visit (HOSPITAL_COMMUNITY)
Admission: RE | Admit: 2012-10-10 | Discharge: 2012-10-10 | Disposition: A | Discharge status: Home / Self Care | Payer: Managed Care, Other (non HMO) | Source: Ambulatory Visit | Attending: General Surgery | Admitting: General Surgery

## 2012-10-10 DIAGNOSIS — K4090 Unilateral inguinal hernia, without obstruction or gangrene, not specified as recurrent: Secondary | ICD-10-CM

## 2012-10-10 DIAGNOSIS — Z01812 Encounter for preprocedural laboratory examination: Secondary | ICD-10-CM | POA: Insufficient documentation

## 2012-10-10 DIAGNOSIS — Z01818 Encounter for other preprocedural examination: Secondary | ICD-10-CM | POA: Insufficient documentation

## 2012-10-10 HISTORY — PX: INSERTION OF MESH: SHX5868

## 2012-10-10 HISTORY — PX: INGUINAL HERNIA REPAIR: SHX194

## 2012-10-10 SURGERY — REPAIR, HERNIA, INGUINAL, LAPAROSCOPIC
Anesthesia: General | Site: Abdomen | Laterality: Right | Wound class: Clean

## 2012-10-10 MED ORDER — SODIUM CHLORIDE 0.9 % IR SOLN
Status: DC | PRN
  Administered 2012-10-10: 1000 mL

## 2012-10-10 MED ORDER — HYDROMORPHONE HCL PF 1 MG/ML IJ SOLN
INTRAMUSCULAR | Status: AC
  Administered 2012-10-10: 0.5 mg via INTRAVENOUS
  Filled 2012-10-10: qty 1

## 2012-10-10 MED ORDER — ROCURONIUM BROMIDE 100 MG/10ML IV SOLN
INTRAVENOUS | Status: DC | PRN
  Administered 2012-10-10: 50 mg via INTRAVENOUS

## 2012-10-10 MED ORDER — FENTANYL CITRATE 0.05 MG/ML IJ SOLN
INTRAMUSCULAR | Status: DC | PRN
  Administered 2012-10-10: 100 ug via INTRAVENOUS
  Administered 2012-10-10: 50 ug via INTRAVENOUS
  Administered 2012-10-10: 150 ug via INTRAVENOUS

## 2012-10-10 MED ORDER — OXYCODONE HCL 5 MG/5ML PO SOLN: 5.0000 mg | Freq: Once | ORAL | Status: DC | PRN

## 2012-10-10 MED ORDER — BUPIVACAINE HCL (PF) 0.25 % IJ SOLN
INTRAMUSCULAR | Status: AC
  Filled 2012-10-10: qty 30

## 2012-10-10 MED ORDER — MEPERIDINE HCL 25 MG/ML IJ SOLN: 6.2500 mg | INTRAMUSCULAR | Status: DC | PRN

## 2012-10-10 MED ORDER — LIDOCAINE HCL (CARDIAC) 20 MG/ML IV SOLN
INTRAVENOUS | Status: DC | PRN
  Administered 2012-10-10: 50 mg via INTRAVENOUS

## 2012-10-10 MED ORDER — PROPOFOL 10 MG/ML IV BOLUS
INTRAVENOUS | Status: DC | PRN
  Administered 2012-10-10: 120 mg via INTRAVENOUS

## 2012-10-10 MED ORDER — LACTATED RINGERS IV SOLN
INTRAVENOUS | Status: DC
  Administered 2012-10-10: 09:00:00 via INTRAVENOUS

## 2012-10-10 MED ORDER — LIDOCAINE HCL 4 % MT SOLN
OROMUCOSAL | Status: DC | PRN
  Administered 2012-10-10: 4 mL via TOPICAL

## 2012-10-10 MED ORDER — OXYCODONE HCL 5 MG PO TABS
5.0000 mg | ORAL_TABLET | ORAL | Status: DC | PRN
Start: 2012-10-10 — End: 2021-07-06

## 2012-10-10 MED ORDER — ESMOLOL HCL 10 MG/ML IV SOLN
INTRAVENOUS | Status: DC | PRN
  Administered 2012-10-10: 10 mg via INTRAVENOUS

## 2012-10-10 MED ORDER — MIDAZOLAM HCL 5 MG/5ML IJ SOLN
INTRAMUSCULAR | Status: DC | PRN
  Administered 2012-10-10: 2 mg via INTRAVENOUS

## 2012-10-10 MED ORDER — NEOSTIGMINE METHYLSULFATE 1 MG/ML IJ SOLN
INTRAMUSCULAR | Status: DC | PRN
  Administered 2012-10-10: 4 mg via INTRAVENOUS

## 2012-10-10 MED ORDER — HYDROMORPHONE HCL PF 1 MG/ML IJ SOLN
0.2500 mg | INTRAMUSCULAR | Status: DC | PRN
  Administered 2012-10-10: 0.5 mg via INTRAVENOUS

## 2012-10-10 MED ORDER — ONDANSETRON HCL 4 MG/2ML IJ SOLN
4.0000 mg | Freq: Once | INTRAMUSCULAR | Status: AC | PRN
  Administered 2012-10-10: 4 mg via INTRAVENOUS

## 2012-10-10 MED ORDER — OXYCODONE HCL 5 MG PO TABS: 5.0000 mg | ORAL_TABLET | Freq: Once | ORAL | Status: DC | PRN

## 2012-10-10 MED ORDER — LIDOCAINE-EPINEPHRINE (PF) 1 %-1:200000 IJ SOLN
INTRAMUSCULAR | Status: DC | PRN
  Administered 2012-10-10: 09:00:00 via INTRAMUSCULAR

## 2012-10-10 MED ORDER — LACTATED RINGERS IV SOLN
INTRAVENOUS | Status: DC | PRN
  Administered 2012-10-10 (×2): via INTRAVENOUS

## 2012-10-10 MED ORDER — ONDANSETRON HCL 4 MG/2ML IJ SOLN
INTRAMUSCULAR | Status: DC | PRN
  Administered 2012-10-10: 4 mg via INTRAVENOUS

## 2012-10-10 MED ORDER — ONDANSETRON HCL 4 MG/2ML IJ SOLN
INTRAMUSCULAR | Status: AC
  Administered 2012-10-10: 4 mg via INTRAVENOUS
  Filled 2012-10-10: qty 2

## 2012-10-10 MED ORDER — LIDOCAINE-EPINEPHRINE (PF) 1 %-1:200000 IJ SOLN
INTRAMUSCULAR | Status: AC
  Filled 2012-10-10: qty 10

## 2012-10-10 MED ORDER — GLYCOPYRROLATE 0.2 MG/ML IJ SOLN
INTRAMUSCULAR | Status: DC | PRN
  Administered 2012-10-10: 0.4 mg via INTRAVENOUS

## 2012-10-10 MED ORDER — 0.9 % SODIUM CHLORIDE (POUR BTL) OPTIME
TOPICAL | Status: DC | PRN
  Administered 2012-10-10: 1000 mL

## 2012-10-10 SURGICAL SUPPLY — 62 items
ADH SKN CLS APL DERMABOND .7 (GAUZE/BANDAGES/DRESSINGS) ×1
APL SKNCLS STERI-STRIP NONHPOA (GAUZE/BANDAGES/DRESSINGS)
APPLICATOR COTTON TIP 6IN STRL (MISCELLANEOUS) IMPLANT
APPLIER CLIP 5 13 M/L LIGAMAX5 (MISCELLANEOUS)
APPLIER CLIP ROT 10 11.4 M/L (STAPLE)
APR CLP MED LRG 11.4X10 (STAPLE)
APR CLP MED LRG 5 ANG JAW (MISCELLANEOUS)
BENZOIN TINCTURE PRP APPL 2/3 (GAUZE/BANDAGES/DRESSINGS) IMPLANT
BLADE SURG 12 STRL SS (BLADE) ×2 IMPLANT
BLADE SURG ROTATE 9660 (MISCELLANEOUS) ×1 IMPLANT
CANISTER SUCTION 2500CC (MISCELLANEOUS) IMPLANT
CHLORAPREP W/TINT 26ML (MISCELLANEOUS) ×2 IMPLANT
CLIP APPLIE 5 13 M/L LIGAMAX5 (MISCELLANEOUS) IMPLANT
CLIP APPLIE ROT 10 11.4 M/L (STAPLE) IMPLANT
CLOTH BEACON ORANGE TIMEOUT ST (SAFETY) ×2 IMPLANT
COVER SURGICAL LIGHT HANDLE (MISCELLANEOUS) ×2 IMPLANT
DECANTER SPIKE VIAL GLASS SM (MISCELLANEOUS) ×1 IMPLANT
DERMABOND ADVANCED (GAUZE/BANDAGES/DRESSINGS) ×1
DERMABOND ADVANCED .7 DNX12 (GAUZE/BANDAGES/DRESSINGS) IMPLANT
DEVICE SECURE STRAP 25 ABSORB (INSTRUMENTS) ×2 IMPLANT
DISSECT BALLN SPACEMKR + OVL (BALLOONS) ×2
DISSECTOR BALLN SPACEMKR + OVL (BALLOONS) IMPLANT
DRAPE INCISE IOBAN 66X45 STRL (DRAPES) IMPLANT
DRAPE UTILITY 15X26 W/TAPE STR (DRAPE) ×4 IMPLANT
DRSG TEGADERM 4X4.75 (GAUZE/BANDAGES/DRESSINGS) IMPLANT
ELECT CAUTERY BLADE 6.4 (BLADE) ×2 IMPLANT
ELECT REM PT RETURN 9FT ADLT (ELECTROSURGICAL) ×2
ELECTRODE REM PT RTRN 9FT ADLT (ELECTROSURGICAL) ×1 IMPLANT
GAUZE SPONGE 2X2 8PLY STRL LF (GAUZE/BANDAGES/DRESSINGS) IMPLANT
GLOVE BIO SURGEON STRL SZ7.5 (GLOVE) ×1 IMPLANT
GLOVE BIOGEL PI IND STRL 6.5 (GLOVE) IMPLANT
GLOVE BIOGEL PI IND STRL 7.0 (GLOVE) IMPLANT
GLOVE BIOGEL PI IND STRL 7.5 (GLOVE) IMPLANT
GLOVE BIOGEL PI INDICATOR 6.5 (GLOVE) ×1
GLOVE BIOGEL PI INDICATOR 7.0 (GLOVE) ×1
GLOVE BIOGEL PI INDICATOR 7.5 (GLOVE) ×1
GLOVE ECLIPSE 6.5 STRL STRAW (GLOVE) ×1 IMPLANT
GLOVE SURG SS PI 7.5 STRL IVOR (GLOVE) ×4 IMPLANT
GOWN STRL NON-REIN LRG LVL3 (GOWN DISPOSABLE) ×4 IMPLANT
GOWN STRL REIN XL XLG (GOWN DISPOSABLE) ×2 IMPLANT
KIT BASIN OR (CUSTOM PROCEDURE TRAY) ×2 IMPLANT
KIT ROOM TURNOVER OR (KITS) ×2 IMPLANT
MARKER SKIN DUAL TIP RULER LAB (MISCELLANEOUS) ×2 IMPLANT
MESH ULTRAPRO 6X6 15CM15CM (Mesh General) ×1 IMPLANT
NS IRRIG 1000ML POUR BTL (IV SOLUTION) ×2 IMPLANT
PAD ARMBOARD 7.5X6 YLW CONV (MISCELLANEOUS) ×4 IMPLANT
PENCIL BUTTON HOLSTER BLD 10FT (ELECTRODE) ×2 IMPLANT
SCISSORS LAP 5X35 DISP (ENDOMECHANICALS) IMPLANT
SET IRRIG TUBING LAPAROSCOPIC (IRRIGATION / IRRIGATOR) ×1 IMPLANT
SLEEVE ENDOPATH XCEL 5M (ENDOMECHANICALS) ×1 IMPLANT
SPONGE GAUZE 2X2 STER 10/PKG (GAUZE/BANDAGES/DRESSINGS)
SUT MNCRL AB 4-0 PS2 18 (SUTURE) ×3 IMPLANT
SUT VICRYL 0 UR6 27IN ABS (SUTURE) ×2 IMPLANT
TIP RIGID 35CM EVICEL (HEMOSTASIS) IMPLANT
TOWEL OR 17X24 6PK STRL BLUE (TOWEL DISPOSABLE) ×1 IMPLANT
TOWEL OR 17X26 10 PK STRL BLUE (TOWEL DISPOSABLE) ×2 IMPLANT
TOWEL OR NON WOVEN STRL DISP B (DISPOSABLE) ×1 IMPLANT
TRAY FOLEY CATH 14FR (SET/KITS/TRAYS/PACK) ×2 IMPLANT
TRAY LAPAROSCOPIC (CUSTOM PROCEDURE TRAY) ×2 IMPLANT
TROCAR HASSON GELL 12X100 (TROCAR) ×1 IMPLANT
TROCAR XCEL BLADELESS 5X75MML (TROCAR) ×5 IMPLANT
TROCAR XCEL BLUNT TIP 100MML (ENDOMECHANICALS) ×1 IMPLANT

## 2012-10-10 NOTE — Anesthesia Procedure Notes (Signed)
Procedure Name: Intubation Date/Time: 10/10/2012 9:30 AM Performed by: Ellin Goodie Pre-anesthesia Checklist: Patient identified, Emergency Drugs available, Suction available, Patient being monitored and Timeout performed Patient Re-evaluated:Patient Re-evaluated prior to inductionOxygen Delivery Method: Circle system utilized Preoxygenation: Pre-oxygenation with 100% oxygen Intubation Type: IV induction Ventilation: Mask ventilation without difficulty Laryngoscope Size: Mac and 3 Grade View: Grade I Tube type: Oral Number of attempts: 1 Airway Equipment and Method: Stylet Placement Confirmation: ETT inserted through vocal cords under direct vision,  positive ETCO2 and breath sounds checked- equal and bilateral Secured at: 21 cm Tube secured with: Tape Dental Injury: Teeth and Oropharynx as per pre-operative assessment

## 2012-10-10 NOTE — Anesthesia Preprocedure Evaluation (Addendum)
Anesthesia Evaluation  Patient identified by MRN, date of birth, ID band Patient awake    Reviewed: Allergy & Precautions, H&P , NPO status , Patient's Chart, lab work & pertinent test results, reviewed documented beta blocker date and time   Airway Mallampati: I TM Distance: >3 FB Neck ROM: Full    Dental  (+) Teeth Intact and Dental Advisory Given   Pulmonary asthma ,  breath sounds clear to auscultation        Cardiovascular hypertension, Pt. on medications Rhythm:Regular Rate:Normal     Neuro/Psych Anxiety Depression    GI/Hepatic   Endo/Other    Renal/GU      Musculoskeletal   Abdominal (+)  Abdomen: soft. Bowel sounds: normal.  Peds  Hematology   Anesthesia Other Findings   Reproductive/Obstetrics                         Anesthesia Physical Anesthesia Plan  ASA: II  Anesthesia Plan: General   Post-op Pain Management:    Induction: Intravenous  Airway Management Planned: Oral ETT  Additional Equipment:   Intra-op Plan:   Post-operative Plan: Extubation in OR  Informed Consent: I have reviewed the patients History and Physical, chart, labs and discussed the procedure including the risks, benefits and alternatives for the proposed anesthesia with the patient or authorized representative who has indicated his/her understanding and acceptance.     Plan Discussed with: CRNA and Surgeon  Anesthesia Plan Comments:         Anesthesia Quick Evaluation

## 2012-10-10 NOTE — Transfer of Care (Signed)
Immediate Anesthesia Transfer of Care Note  Patient: [name]  Procedure(s) Performed: Procedure(s) (LRB) with comments: LAPAROSCOPIC INGUINAL HERNIA (Right) INSERTION OF MESH (Right)  Patient Location: PACU  Anesthesia Type:General  Level of Consciousness: awake, alert  and oriented  Airway & Oxygen Therapy: Patient Spontanous Breathing  Post-op Assessment: Report given to PACU RN  Post vital signs: stable  Complications: No apparent anesthesia complications

## 2012-10-10 NOTE — H&P (Signed)
[name] is a 56 y.o. male. This patient is known to me for prior evaluation of amildly symptomatic right inguinal hernia. He does do some lifting and driving for work and it occasionally causes him discomfort. It is not currently bothering him but he is now working with his work to arrange for some time off to undergo surgery. He says it has not really changed much since our last visit. he has no obstructive symptoms.HPI  Past Medical History   Diagnosis  Date   .  Hypertension    .  Asthma    .  Erectile dysfunction    .  Spermatocele    .  Impotence of organic origin    .  Pure hypercholesterolemia    .  Allergy    .  Hypopotassemia    .  Inguinal hernia without mention of obstruction or gangrene, unilateral or unspecified, (not specified as recurrent)     History reviewed. No pertinent past surgical history.  History reviewed. No pertinent family history.  Social History  History   Substance Use Topics   .  Smoking status:  Never Smoker   .  Smokeless tobacco:  Never Used   .  Alcohol Use:  0.5 oz/week     1 drink(s) per week    Allergies   Allergen  Reactions   .  Lipitor (Atorvastatin)  Other (See Comments)     Muscle aches    Current Outpatient Prescriptions   Medication  Sig  Dispense  Refill   .  albuterol (PROVENTIL) (2.5 MG/3ML) 0.083% nebulizer solution  Take 2.5 mg by nebulization every 6 (six) hours as needed.     .  cetirizine (ZYRTEC) 10 MG tablet  Take 10 mg by mouth daily.     .  fluticasone (FLOVENT HFA) 110 MCG/ACT inhaler  Inhale 1 puff into the lungs 2 (two) times daily.     .  hydrochlorothiazide (HYDRODIURIL) 25 MG tablet  Take 25 mg by mouth daily.     Marland Kitchen  KLOR-CON M10 10 MEQ tablet      .  potassium chloride (K-DUR) 10 MEQ tablet  Take 10 mEq by mouth 2 (two) times daily.     .  sildenafil (VIAGRA) 100 MG tablet  Take 100 mg by mouth daily as needed.      Review of Systems  Review of Systems  All other review of systems negative or  noncontributory except as stated in the HPI  Wt Readings from Last 3 Encounters:  10/01/12 170 lb (77.111 kg)  08/22/12 173 lb 9.6 oz (78.744 kg)  05/01/12 173 lb 3.2 oz (78.563 kg)   Temp Readings from Last 3 Encounters:  10/10/12 98.5 F (36.9 C) Oral  10/10/12 98.5 F (36.9 C) Oral  10/01/12 98.7 F (37.1 C)    BP Readings from Last 3 Encounters:  10/10/12 135/88  10/10/12 135/88  10/01/12 122/85   Pulse Readings from Last 3 Encounters:  10/10/12 68  10/10/12 68  10/01/12 85    Physical Exam  Physical Exam  Physical Exam  Vitals reviewed.  Constitutional: He is oriented to person, place, and time. He appears well-developed and well-nourished. No distress.  HENT:  Head: Normocephalic and atraumatic.  Mouth/Throat: No oropharyngeal exudate.  Eyes: Conjunctivae and EOM are normal. Pupils are equal, round, and reactive to light. Right eye exhibits no discharge. Left eye exhibits no discharge. No scleral icterus.  Neck: Normal range of motion. No tracheal deviation present.  Cardiovascular:  Normal rate, regular rhythm and normal heart sounds.  Pulmonary/Chest: Effort normal and breath sounds normal. No stridor. No respiratory distress. He has no wheezes. He has no rales. He exhibits no tenderness.  Abdominal: Soft. Bowel sounds are normal. He exhibits no distension and no mass. There is no tenderness. There is no rebound and no guarding. reducible right inguinal hernia. No left inguinal hernia.  Musculoskeletal: Normal range of motion. He exhibits no edema and no tenderness.  Neurological: He is alert and oriented to person, place, and time.  Skin: Skin is warm and dry. No rash noted. He is not diaphoretic. No erythema. No pallor.  Psychiatric: He has a normal mood and affect. His behavior is normal. Judgment and thought content normal.  Data Reviewed  Assessment   Reducible right inguinal hernia  I have seen and examined him in the preop area. No change from prior visits.   I marked the site.  We discussed the pros and cons and the risks and benefits of the procedure. We discussed the risks of infection, bleeding, pain, scarring, recurrence, persistent symptoms, need for open surgery, injury to the testicle or vas deferens, and nerve injury and chronic pain. He expressed understanding and would like to proceed with laparoscopic right inguinal hernia repair with mesh.

## 2012-10-10 NOTE — Brief Op Note (Signed)
10/10/2012  11:17 AM  PATIENT:  [name]  56 y.o. male  PRE-OPERATIVE DIAGNOSIS:  RIGHT INGUINAL HERNIA  POST-OPERATIVE DIAGNOSIS:  RIGHT INGUINAL HERNIA  PROCEDURE:  Procedure(s) (LRB) with comments: LAPAROSCOPIC INGUINAL HERNIA (Right) INSERTION OF MESH (Right)  SURGEON:  Surgeon(s) and Role:    * Lodema Pilot, DO - Primary  PHYSICIAN ASSISTANT:   ASSISTANTS: none   ANESTHESIA:   general  EBL:  Total I/O In: 1000 [I.V.:1000] Out: 200 [Urine:200]  BLOOD ADMINISTERED:none  DRAINS: none   LOCAL MEDICATIONS USED:  MARCAINE    and LIDOCAINE   SPECIMEN:  No Specimen  DISPOSITION OF SPECIMEN:  N/A  COUNTS:  YES  TOURNIQUET:  * No tourniquets in log *  DICTATION: .Other Dictation: Dictation Number [id number]  PLAN OF CARE: Discharge to home after PACU  PATIENT DISPOSITION:  PACU - hemodynamically stable.   Delay start of Pharmacological VTE agent (>24hrs) due to surgical blood loss or risk of bleeding: no

## 2012-10-10 NOTE — Progress Notes (Signed)
Patient getting PO and IV hydration. Patient aware that he must urinate prior to discharge.

## 2012-10-10 NOTE — Op Note (Signed)
[name], [name] NO.:  [id number]  MEDICAL RECORD NO.:  [id number]  LOCATION:  [facility]                         FACILITY:  [facility]  PHYSICIAN:  Lodema Pilot, MD       DATE OF BIRTH:  [birth year]  DATE OF PROCEDURE:  10/10/2012 DATE OF DISCHARGE:  10/10/2012                              OPERATIVE REPORT   PROCEDURE:  Laparoscopic right inguinal hernia repair with mesh.  PREOPERATIVE DIAGNOSIS:  Right inguinal hernia.  POSTOPERATIVE DIAGNOSIS:  Right inguinal hernia.  SURGEON:  Lodema Pilot, MD  ASSISTANT:  None.  ANESTHESIA:  General endotracheal tube anesthesia with 23 mL of 1% lidocaine with epinephrine and 0.25%Marcaine in a 50:50 mixture.  FLUIDS:  1200 mL of crystalloid.  ESTIMATED BLOOD LOSS:  Minimal.  DRAINS:  None.  SPECIMENS:  None.  COMPLICATIONS:  None apparent.  FINDINGS:  Indirect right inguinal hernia, no evidence of left inguinal hernia repaired with 13 cm x 14 cm piece of Ultrapro mesh.  INDICATION FOR PROCEDURE:  [name] is a 56 year old male with a mildly symptomatic right inguinal hernia on exam and desires operative repair.  OPERATIVE DETAILS:  [name] was seen and evaluated in the preoperative area and risks and benefits of procedure were discussed in lay terms.  Informed consent was obtained.  The surgical site was marked prior to anesthetic administration and he was given prophylactic antibiotics and taken to the operating room, placed on table in supine position.  General endotracheal tube anesthesia was obtained and Foley catheter was placed.  His abdomen and groin were prepped and draped in a standard surgical fashion.  Procedure time-out was performed with all operative team members confirmed proper patient, procedure, and a semicircular infraumbilical incision was made in the skin.  Dissection carried down through the subcutaneous tissue using blunt dissection. The abdominal wall fascia was sharply  incised with a 12-blade visualizing the right rectus muscle.  The muscle was secured anteriorly and a space maker balloon dissector was placed in the midline at the pubic tubercle and inflated with 25 pumps on the balloon under direct visualization.  After the balloon was removed and the gas was applied and the peritoneum insufflated to 12 mmHg, however, we noticed immediately that the peritoneum had blown up and the remainder of the abdomen had blown up as well.  At this point, I decided to convert to a transabdominal approach and through this hole in the peritoneum at the umbilicus, I placed a 12 mm Hasson port and two 5 mm right and left rectus port were placed under direct visualization.  Because he had drooping of the peritoneum in the midline such that I could not adequately use the infraumbilical port, I placed another 5 mm port in the upper midline.  He had a visible indirect right inguinal hernia without any evidence of any left inguinal hernia seen.  There was no evidence of injury to the bowel upon entry.  Using the scissors, I took down the peritoneum on the right side, which was very easy to do since we had dissected the preperitoneal space with the balloon.  I then bluntly dissected it medially to the pubic tubercle along the Cooper's ligament and  laterally to the anterior superior iliac spine.  He had no evidence of any direct hernia.  He had an indirect hernia and this was reduced with blunt dissection.  Some sharp dissection was used at the apex of the hernia sac as it was stuck in the cord.  This allowed it to come down freely.  After the peritoneum and hernia sac was taken down posteriorly beyond where the vas deferens turns medially.  I could see that there was no remaining hernia sac, and I placed a 13 cm x 14 cm piece of Ultrapro mesh into the abdomen and in the preperitoneal space. This was tacked medially at the pubic tubercle and medially along the anterior  abdominal wall and the mesh was laid out flat over the hernia and the cord structures.  This was tucked down posteriorly over the iliac vessels covering the direct defect, also covering the cord structures and indirect defect.  The mesh was tacked laterally on the anterior abdominal wall.  The mesh was tucked posteriorly and then the peritoneum was tacked back up to the abdominal wall with the secure strap tacking device closing the peritoneum over the mesh.  The mesh was covered and with the hernia sac that was protruding on the inside, I tacked this to the abdominal wall as well so this would not want to sneak back around the mesh until the mesh was fixated and scarred into the abdominal wall.  All the trocars except for 1 trocar was removed and the gas was removed and the fascia was approximated at the umbilicus with interrupted 0-Vicryl sutures in open fashion.  The abdomen was re- insufflated with carbon dioxide gas and the abdominal wall closure was noted to be adequate without any evidence of bowel injury or bleeding. The abdomen was noted to be hemostatic and the peritoneal closure was noted to be adequate.  The gas was removed and the final trocars removed.  The skin incisions were injected with a total of 23 mL of 1% lidocaine with epinephrine and 0.25% Marcaine in a 50:50 mixture.  Skin edges were approximated with 4-0 Monocryl subcuticular suture.  Skin was washed and dried and Dermabond was applied.  All sponge, needle, and instrument counts were correct at the end of the case.  The patient tolerated the procedure well without apparent complications.          ______________________________ Lodema Pilot, MD     BL/MEDQ  D:  10/10/2012  T:  10/10/2012  Job:  [id number]

## 2012-10-10 NOTE — Anesthesia Postprocedure Evaluation (Signed)
Anesthesia Post Note  Patient: [name]  Procedure(s) Performed: Procedure(s) (LRB): LAPAROSCOPIC INGUINAL HERNIA (Right) INSERTION OF MESH (Right)  Anesthesia type: general  Patient location: PACU  Post pain: Pain level controlled  Post assessment: Patient's Cardiovascular Status Stable  Last Vitals:  Filed Vitals:   10/10/12 1200  BP: 110/75  Pulse: 70  Temp:   Resp: 11    Post vital signs: Reviewed and stable  Level of consciousness: sedated  Complications: No apparent anesthesia complications

## 2012-10-10 NOTE — Progress Notes (Signed)
Patient able to tolerate PO ginger ale. Patient voided in toilet unable to determine amount. Patient reports that it burned with urination explained to patient that it was normal post foley use during surgery. Educated patient on need to voided every 2 hours and to increase fluid intake for next several days.

## 2012-10-10 NOTE — Progress Notes (Addendum)
Patient urinated now is nauseated and has vomited. Patient reports since he has vomited he feels better and felt like he drank the water to fast. Will give patient some ginger ale prior to leaving to ensure that he can hold down PO fluids prior to discharge.

## 2012-10-10 NOTE — Preoperative (Signed)
Beta Blockers   Reason not to administer Beta Blockers:Not Applicable 

## 2012-10-14 ENCOUNTER — Encounter (HOSPITAL_COMMUNITY): Payer: Self-pay | Admitting: General Surgery

## 2012-10-31 ENCOUNTER — Encounter (INDEPENDENT_AMBULATORY_CARE_PROVIDER_SITE_OTHER): Payer: Self-pay | Admitting: General Surgery

## 2012-10-31 ENCOUNTER — Ambulatory Visit (INDEPENDENT_AMBULATORY_CARE_PROVIDER_SITE_OTHER): Payer: Managed Care, Other (non HMO) | Admitting: General Surgery

## 2012-10-31 VITALS — BP 117/83 | HR 83 | Temp 97.6°F | Resp 14 | Ht 66.0 in | Wt 178.0 lb

## 2012-10-31 DIAGNOSIS — Z5189 Encounter for other specified aftercare: Secondary | ICD-10-CM

## 2012-10-31 DIAGNOSIS — Z4889 Encounter for other specified surgical aftercare: Secondary | ICD-10-CM

## 2012-10-31 NOTE — Progress Notes (Signed)
Subjective:     Patient ID: [name], male   DOB: [birth year], 57 y.o.   MRN: [id number]  HPI This patient follows up 3 weeks status post laparoscopic repair of right inguinal hernia. He says that he has recovered very nicely. He is not completed all this pain medications and is not taking any currently. He is gradually increasing his activity as tolerated and wants to  return to work next week. He has not noticed any bulge and says that his bowels are functioning normally he has no urinary symptoms or no complaints at all.  Review of Systems     Objective:   Physical Exam No acute distress and nontoxic-appearing His incisions are healing well without sign of infection. There is no evidence of recurrent hernia and no bulge in the groin.    Assessment:     Status post lap Right inguinal hernia repair-doing well I recommended that he continue with light duty for another week and he can return to work on the 27th and gradually increase his activity as tolerated.    Plan:     Light duty for another week and followup when necessary

## 2015-12-29 ENCOUNTER — Ambulatory Visit
Admission: RE | Admit: 2015-12-29 | Discharge: 2015-12-29 | Disposition: A | Discharge status: Home / Self Care | Payer: BLUE CROSS/BLUE SHIELD | Source: Ambulatory Visit | Attending: Family Medicine | Admitting: Family Medicine

## 2015-12-29 ENCOUNTER — Other Ambulatory Visit: Payer: Self-pay | Admitting: Family Medicine

## 2015-12-29 DIAGNOSIS — M545 Low back pain: Secondary | ICD-10-CM

## 2016-07-14 DIAGNOSIS — J4530 Mild persistent asthma, uncomplicated: Secondary | ICD-10-CM | POA: Diagnosis not present

## 2016-07-14 DIAGNOSIS — J309 Allergic rhinitis, unspecified: Secondary | ICD-10-CM | POA: Diagnosis not present

## 2016-07-14 DIAGNOSIS — I10 Essential (primary) hypertension: Secondary | ICD-10-CM | POA: Diagnosis not present

## 2016-07-14 DIAGNOSIS — E7800 Pure hypercholesterolemia, unspecified: Secondary | ICD-10-CM | POA: Diagnosis not present

## 2017-01-10 DIAGNOSIS — E7800 Pure hypercholesterolemia, unspecified: Secondary | ICD-10-CM | POA: Diagnosis not present

## 2017-01-10 DIAGNOSIS — Z23 Encounter for immunization: Secondary | ICD-10-CM | POA: Diagnosis not present

## 2017-01-10 DIAGNOSIS — J4530 Mild persistent asthma, uncomplicated: Secondary | ICD-10-CM | POA: Diagnosis not present

## 2017-01-10 DIAGNOSIS — J309 Allergic rhinitis, unspecified: Secondary | ICD-10-CM | POA: Diagnosis not present

## 2017-01-10 DIAGNOSIS — Z125 Encounter for screening for malignant neoplasm of prostate: Secondary | ICD-10-CM | POA: Diagnosis not present

## 2017-01-10 DIAGNOSIS — I10 Essential (primary) hypertension: Secondary | ICD-10-CM | POA: Diagnosis not present

## 2017-01-30 ENCOUNTER — Other Ambulatory Visit: Payer: Self-pay | Admitting: Orthopedic Surgery

## 2017-02-07 DIAGNOSIS — Z1211 Encounter for screening for malignant neoplasm of colon: Secondary | ICD-10-CM | POA: Diagnosis not present

## 2017-02-22 SURGERY — Surgical Case
Anesthesia: *Unknown

## 2017-07-13 DIAGNOSIS — E7800 Pure hypercholesterolemia, unspecified: Secondary | ICD-10-CM | POA: Diagnosis not present

## 2017-07-13 DIAGNOSIS — I10 Essential (primary) hypertension: Secondary | ICD-10-CM | POA: Diagnosis not present

## 2017-07-13 DIAGNOSIS — J4530 Mild persistent asthma, uncomplicated: Secondary | ICD-10-CM | POA: Diagnosis not present

## 2017-07-13 DIAGNOSIS — J309 Allergic rhinitis, unspecified: Secondary | ICD-10-CM | POA: Diagnosis not present

## 2017-07-13 DIAGNOSIS — Z23 Encounter for immunization: Secondary | ICD-10-CM | POA: Diagnosis not present

## 2018-01-11 DIAGNOSIS — J309 Allergic rhinitis, unspecified: Secondary | ICD-10-CM | POA: Diagnosis not present

## 2018-01-11 DIAGNOSIS — Z125 Encounter for screening for malignant neoplasm of prostate: Secondary | ICD-10-CM | POA: Diagnosis not present

## 2018-01-11 DIAGNOSIS — I10 Essential (primary) hypertension: Secondary | ICD-10-CM | POA: Diagnosis not present

## 2018-01-11 DIAGNOSIS — J4530 Mild persistent asthma, uncomplicated: Secondary | ICD-10-CM | POA: Diagnosis not present

## 2018-01-11 DIAGNOSIS — E7800 Pure hypercholesterolemia, unspecified: Secondary | ICD-10-CM | POA: Diagnosis not present

## 2018-11-08 DIAGNOSIS — E7800 Pure hypercholesterolemia, unspecified: Secondary | ICD-10-CM | POA: Diagnosis not present

## 2018-11-08 DIAGNOSIS — I10 Essential (primary) hypertension: Secondary | ICD-10-CM | POA: Diagnosis not present

## 2018-11-08 DIAGNOSIS — J309 Allergic rhinitis, unspecified: Secondary | ICD-10-CM | POA: Diagnosis not present

## 2018-11-08 DIAGNOSIS — Z1159 Encounter for screening for other viral diseases: Secondary | ICD-10-CM | POA: Diagnosis not present

## 2018-11-08 DIAGNOSIS — J4530 Mild persistent asthma, uncomplicated: Secondary | ICD-10-CM | POA: Diagnosis not present

## 2019-05-28 DIAGNOSIS — Z0000 Encounter for general adult medical examination without abnormal findings: Secondary | ICD-10-CM | POA: Diagnosis not present

## 2019-06-03 DIAGNOSIS — I10 Essential (primary) hypertension: Secondary | ICD-10-CM | POA: Diagnosis not present

## 2019-06-03 DIAGNOSIS — Z125 Encounter for screening for malignant neoplasm of prostate: Secondary | ICD-10-CM | POA: Diagnosis not present

## 2019-06-03 DIAGNOSIS — R251 Tremor, unspecified: Secondary | ICD-10-CM | POA: Diagnosis not present

## 2019-12-02 ENCOUNTER — Ambulatory Visit
Admission: RE | Admit: 2019-12-02 | Discharge: 2019-12-02 | Disposition: A | Discharge status: Home / Self Care | Payer: BLUE CROSS/BLUE SHIELD | Source: Ambulatory Visit | Attending: Family Medicine | Admitting: Family Medicine

## 2019-12-02 ENCOUNTER — Other Ambulatory Visit: Payer: Self-pay | Admitting: Family Medicine

## 2019-12-02 DIAGNOSIS — E7800 Pure hypercholesterolemia, unspecified: Secondary | ICD-10-CM | POA: Diagnosis not present

## 2019-12-02 DIAGNOSIS — J4530 Mild persistent asthma, uncomplicated: Secondary | ICD-10-CM | POA: Diagnosis not present

## 2019-12-02 DIAGNOSIS — M25552 Pain in left hip: Secondary | ICD-10-CM

## 2019-12-02 DIAGNOSIS — I10 Essential (primary) hypertension: Secondary | ICD-10-CM | POA: Diagnosis not present

## 2019-12-02 DIAGNOSIS — J309 Allergic rhinitis, unspecified: Secondary | ICD-10-CM | POA: Diagnosis not present

## 2020-02-11 DIAGNOSIS — M25552 Pain in left hip: Secondary | ICD-10-CM | POA: Diagnosis not present

## 2020-02-17 DIAGNOSIS — M25551 Pain in right hip: Secondary | ICD-10-CM | POA: Diagnosis not present

## 2020-03-04 DIAGNOSIS — M25552 Pain in left hip: Secondary | ICD-10-CM | POA: Diagnosis not present

## 2020-03-23 DIAGNOSIS — M7062 Trochanteric bursitis, left hip: Secondary | ICD-10-CM | POA: Diagnosis not present

## 2020-04-02 DIAGNOSIS — M1612 Unilateral primary osteoarthritis, left hip: Secondary | ICD-10-CM | POA: Diagnosis not present

## 2020-05-06 DIAGNOSIS — J4530 Mild persistent asthma, uncomplicated: Secondary | ICD-10-CM | POA: Diagnosis not present

## 2020-05-06 DIAGNOSIS — J309 Allergic rhinitis, unspecified: Secondary | ICD-10-CM | POA: Diagnosis not present

## 2020-05-06 DIAGNOSIS — I10 Essential (primary) hypertension: Secondary | ICD-10-CM | POA: Diagnosis not present

## 2020-05-06 DIAGNOSIS — E7800 Pure hypercholesterolemia, unspecified: Secondary | ICD-10-CM | POA: Diagnosis not present

## 2020-09-05 DIAGNOSIS — I10 Essential (primary) hypertension: Secondary | ICD-10-CM | POA: Diagnosis not present

## 2020-09-05 DIAGNOSIS — M7918 Myalgia, other site: Secondary | ICD-10-CM | POA: Diagnosis not present

## 2020-09-14 DIAGNOSIS — M5442 Lumbago with sciatica, left side: Secondary | ICD-10-CM | POA: Diagnosis not present

## 2020-09-15 DIAGNOSIS — M545 Low back pain, unspecified: Secondary | ICD-10-CM | POA: Diagnosis not present

## 2020-09-16 DIAGNOSIS — M545 Low back pain, unspecified: Secondary | ICD-10-CM | POA: Diagnosis not present

## 2020-09-16 DIAGNOSIS — M5442 Lumbago with sciatica, left side: Secondary | ICD-10-CM | POA: Diagnosis not present

## 2020-09-17 DIAGNOSIS — M5416 Radiculopathy, lumbar region: Secondary | ICD-10-CM | POA: Diagnosis not present

## 2020-09-20 DIAGNOSIS — M545 Low back pain, unspecified: Secondary | ICD-10-CM | POA: Diagnosis not present

## 2020-09-30 IMAGING — CR DG HIP (WITH OR WITHOUT PELVIS) 2-3V*L*
2 series · 2 of 2 positions shown · non-contrast
Comparison: 04/04/2012

CLINICAL DATA: Left hip pain for 2 months.

EXAM:
DG HIP (WITH OR WITHOUT PELVIS) 2-3V LEFT

[t hip ap left]
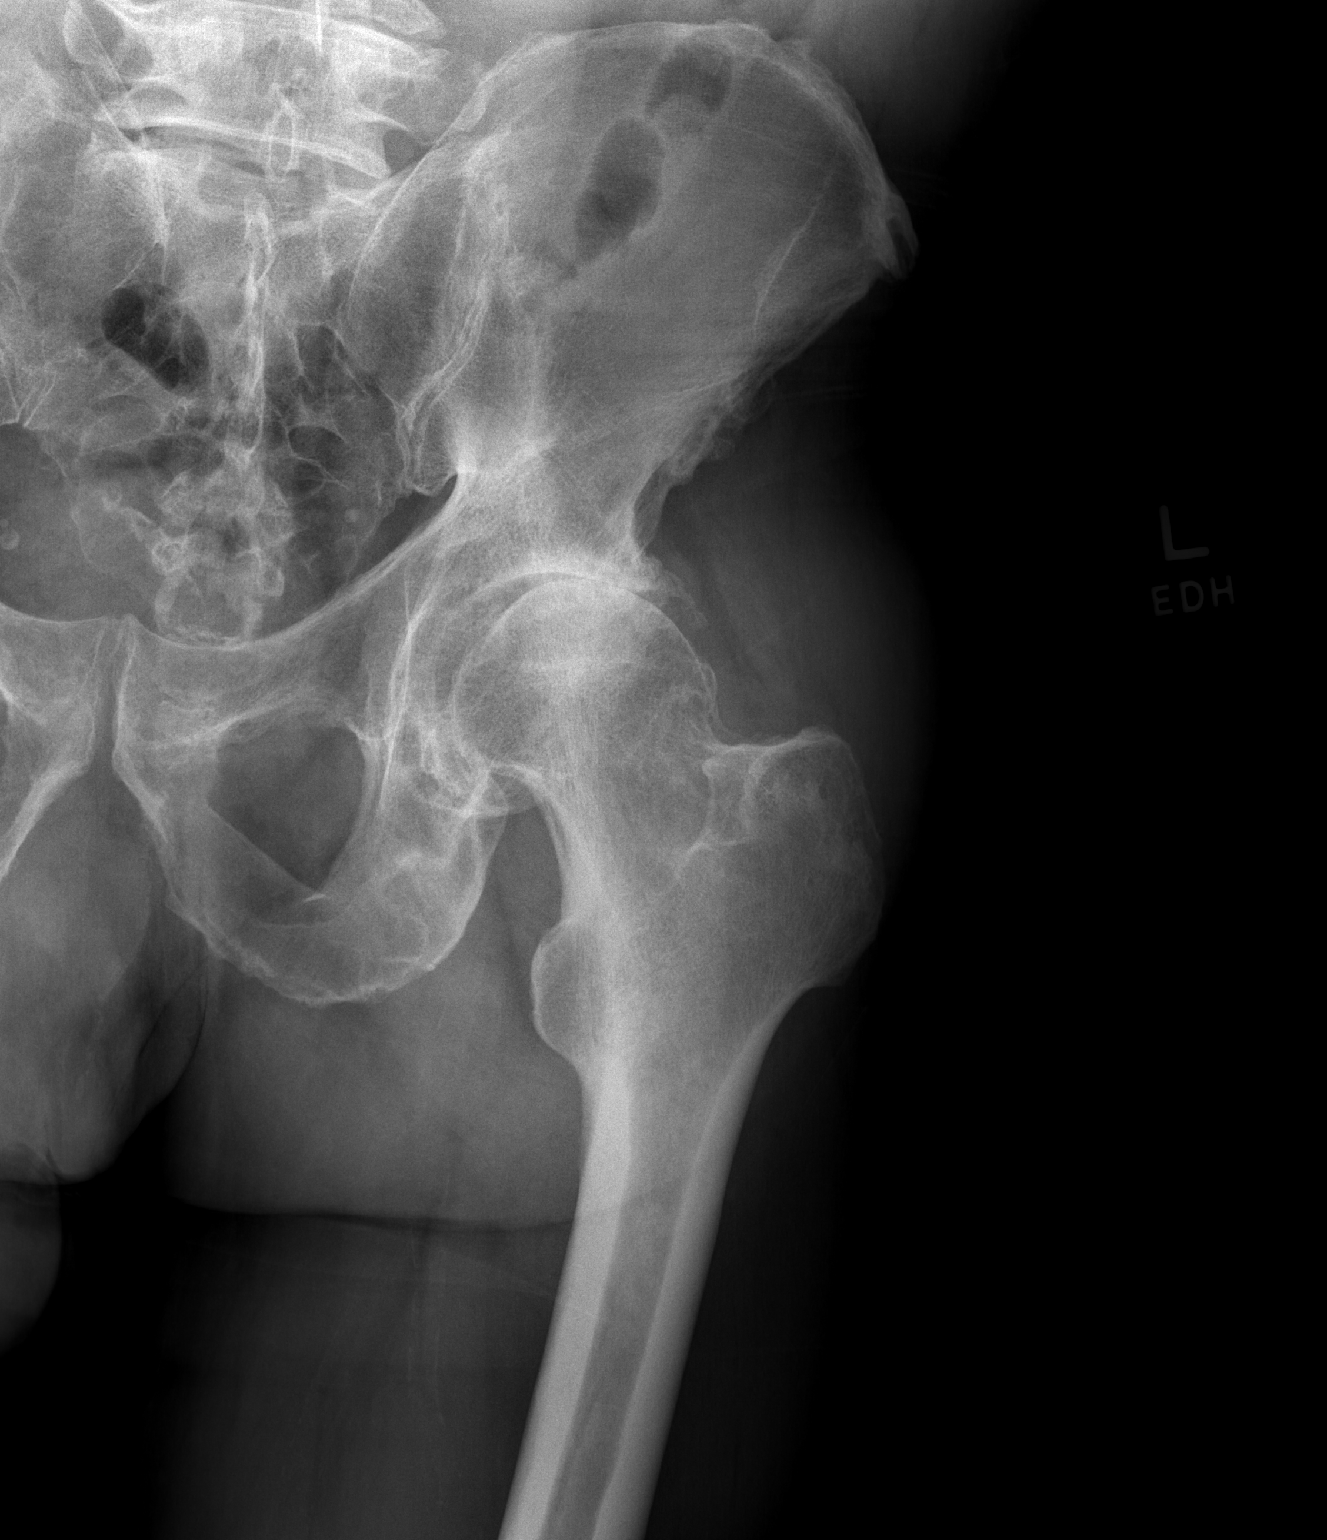

[t hip frog leg left]
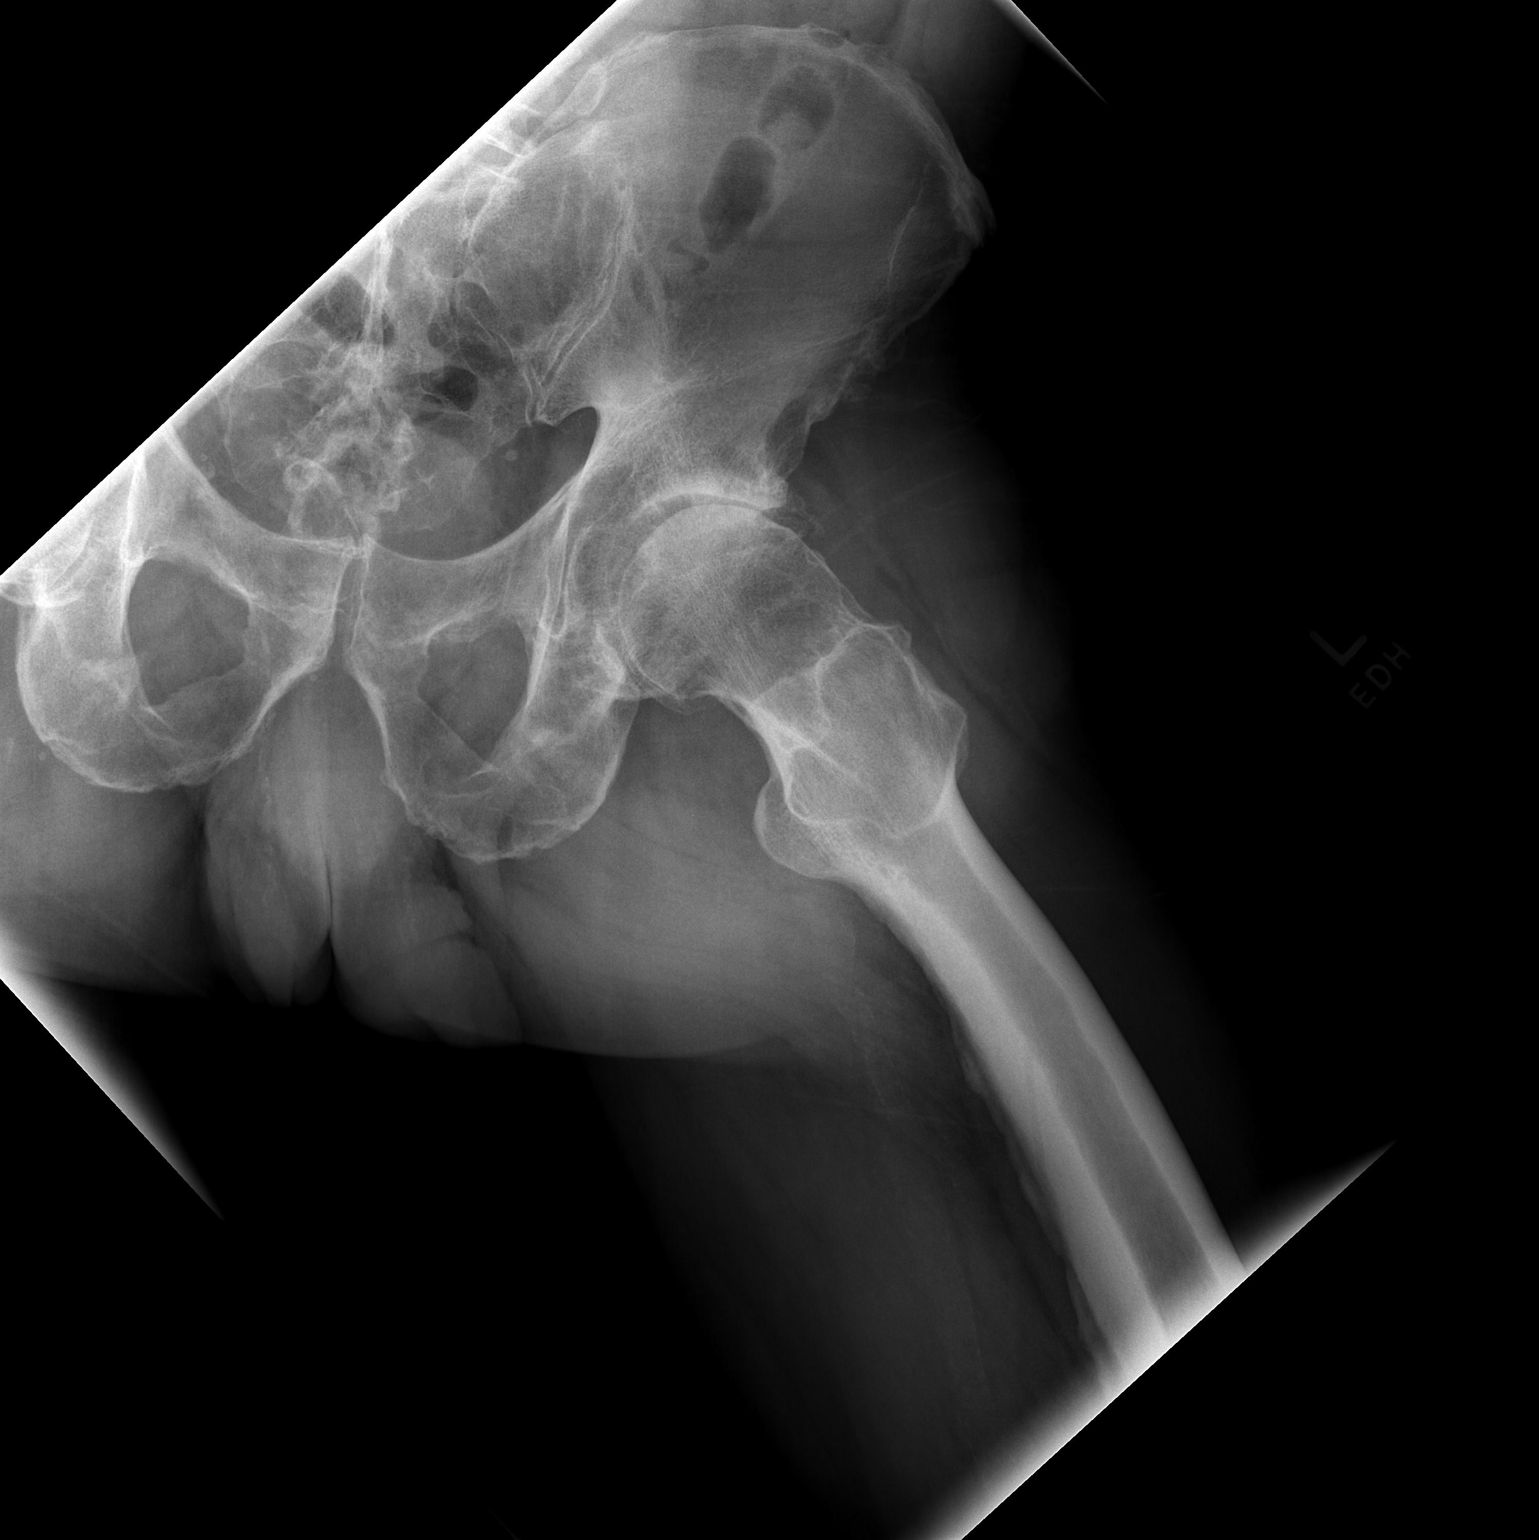

[2 of 2 positions shown; findings below may reference images not displayed]

FINDINGS: Significant left hip joint degenerative changes with marked joint
space narrowing superior laterally. Osteophytic spurring, bony
eburnation and subchondral cystic changes. Findings progressive
since 3207.

No acute bony findings or plain film evidence of avascular necrosis.

The pubic symphysis and left SI joint are intact. No pelvic
fractures.
IMPRESSION: Advanced left hip joint degenerative changes, progressive since

No acute bony findings.

## 2020-10-13 DIAGNOSIS — M5416 Radiculopathy, lumbar region: Secondary | ICD-10-CM | POA: Diagnosis not present

## 2020-11-02 DIAGNOSIS — M5416 Radiculopathy, lumbar region: Secondary | ICD-10-CM | POA: Diagnosis not present

## 2020-11-12 DIAGNOSIS — E7800 Pure hypercholesterolemia, unspecified: Secondary | ICD-10-CM | POA: Diagnosis not present

## 2020-11-12 DIAGNOSIS — Z125 Encounter for screening for malignant neoplasm of prostate: Secondary | ICD-10-CM | POA: Diagnosis not present

## 2020-11-12 DIAGNOSIS — Z0000 Encounter for general adult medical examination without abnormal findings: Secondary | ICD-10-CM | POA: Diagnosis not present

## 2021-01-26 DIAGNOSIS — M5416 Radiculopathy, lumbar region: Secondary | ICD-10-CM | POA: Diagnosis not present

## 2021-05-12 DIAGNOSIS — I10 Essential (primary) hypertension: Secondary | ICD-10-CM | POA: Diagnosis not present

## 2021-05-12 DIAGNOSIS — J309 Allergic rhinitis, unspecified: Secondary | ICD-10-CM | POA: Diagnosis not present

## 2021-05-12 DIAGNOSIS — J4530 Mild persistent asthma, uncomplicated: Secondary | ICD-10-CM | POA: Diagnosis not present

## 2021-05-12 DIAGNOSIS — E7800 Pure hypercholesterolemia, unspecified: Secondary | ICD-10-CM | POA: Diagnosis not present

## 2021-05-19 ENCOUNTER — Encounter: Payer: Self-pay | Admitting: Podiatry

## 2021-05-19 ENCOUNTER — Ambulatory Visit: Payer: BC Managed Care – PPO | Admitting: Podiatry

## 2021-05-19 ENCOUNTER — Other Ambulatory Visit: Payer: Self-pay

## 2021-05-19 ENCOUNTER — Ambulatory Visit (INDEPENDENT_AMBULATORY_CARE_PROVIDER_SITE_OTHER): Payer: BC Managed Care – PPO

## 2021-05-19 DIAGNOSIS — M2141 Flat foot [pes planus] (acquired), right foot: Secondary | ICD-10-CM | POA: Diagnosis not present

## 2021-05-19 DIAGNOSIS — M7751 Other enthesopathy of right foot: Secondary | ICD-10-CM | POA: Diagnosis not present

## 2021-05-19 DIAGNOSIS — M7742 Metatarsalgia, left foot: Secondary | ICD-10-CM

## 2021-05-19 DIAGNOSIS — M21861 Other specified acquired deformities of right lower leg: Secondary | ICD-10-CM

## 2021-05-19 DIAGNOSIS — M216X2 Other acquired deformities of left foot: Secondary | ICD-10-CM

## 2021-05-19 DIAGNOSIS — M21862 Other specified acquired deformities of left lower leg: Secondary | ICD-10-CM

## 2021-05-19 DIAGNOSIS — R269 Unspecified abnormalities of gait and mobility: Secondary | ICD-10-CM

## 2021-05-19 DIAGNOSIS — M216X1 Other acquired deformities of right foot: Secondary | ICD-10-CM

## 2021-05-19 DIAGNOSIS — M7741 Metatarsalgia, right foot: Secondary | ICD-10-CM | POA: Diagnosis not present

## 2021-05-19 DIAGNOSIS — M7752 Other enthesopathy of left foot: Secondary | ICD-10-CM

## 2021-05-19 DIAGNOSIS — M2142 Flat foot [pes planus] (acquired), left foot: Secondary | ICD-10-CM

## 2021-05-23 ENCOUNTER — Encounter: Payer: Self-pay | Admitting: Podiatry

## 2021-05-23 NOTE — Progress Notes (Signed)
  Subjective:  Patient ID: [name], male    DOB: 01/10/1956,  MRN: [id number]  Chief Complaint  Patient presents with   Difficulty Walking    NP, balance and gait issues    65 y.o. male presents with the above complaint. History confirmed with patient.  He has difficulty when working when walking.  Objective:  Physical Exam: warm, good capillary refill, no trophic changes or ulcerative lesions, normal DP and PT pulses, and normal sensory exam.  He has significant pes planus deformity bilaterally.  On gait he has a circumduct her he gait he has minimal dorsiflexion of the ankle.  Significant equinus bilaterally  Radiographs: Multiple views x-ray of both feet: Scattered degenerative changes in the hindfoot he has pes planus deformity with significant declination of the calcaneus and moderate talonavicular arthritis bilaterally Assessment:   1. Metatarsalgia of both feet   2. Pes planus of both feet   3. Gastrocnemius equinus of left lower extremity   4. Gastrocnemius equinus of right lower extremity   5. Altered gait      Plan:  Patient was evaluated and treated and all questions answered.  Reviewed the radiographic clinical findings with the patient.  I think he has significant equinus contributing to his pes planus deformity increased pressure in the forefoot causing metatarsalgia and altering his gait as he cannot adequately dorsiflex the ankle.  I do think he would benefit from orthotic support and he will check with his insurance to see if they would cover this for him I gave him the CPT and ICD 10 codes to evaluate this.  Also sent referral for physical therapy to work on his equinus deformity.  He may require surgical correction of this if it does not improve due to the severity of it.  Return in 2 months after physical therapy and if he is able to get orthotics made.  Return in about 2 months (around 07/19/2021) for follow up tight calf muscles after PT, flat feet /  orthotics .

## 2021-05-28 ENCOUNTER — Ambulatory Visit: Payer: BC Managed Care – PPO | Attending: Podiatry | Admitting: Physical Therapy

## 2021-05-28 ENCOUNTER — Encounter: Payer: Self-pay | Admitting: Physical Therapy

## 2021-05-28 ENCOUNTER — Other Ambulatory Visit: Payer: Self-pay

## 2021-05-28 DIAGNOSIS — M6289 Other specified disorders of muscle: Secondary | ICD-10-CM

## 2021-05-28 DIAGNOSIS — M79672 Pain in left foot: Secondary | ICD-10-CM | POA: Diagnosis not present

## 2021-05-28 DIAGNOSIS — R2689 Other abnormalities of gait and mobility: Secondary | ICD-10-CM | POA: Insufficient documentation

## 2021-05-28 DIAGNOSIS — M79671 Pain in right foot: Secondary | ICD-10-CM | POA: Insufficient documentation

## 2021-05-28 DIAGNOSIS — M6281 Muscle weakness (generalized): Secondary | ICD-10-CM

## 2021-05-28 NOTE — Therapy (Signed)
[place], [place], [address] Phone: [phone]   Fax:  [phone]  Physical Therapy Evaluation Patient Details  Name: [name] MRN: [id number] Date of Birth: 1956/07/24 Referring Provider (PT): Criselda Peaches, [place]  Encounter Date: 05/28/2021   PT End of Session - 05/28/21 1247     Visit Number 1    Number of Visits 16    Date for PT Re-Evaluation 07/23/21    Authorization Type BCBS    PT Start Time 1115    PT Stop Time 1205    PT Time Calculation (min) 50 min    Equipment Utilized During Treatment Gait belt    Activity Tolerance Patient tolerated treatment well    Behavior During Therapy WFL for tasks assessed/performed             Past Medical History:  Diagnosis Date   Allergy    Asthma    Cancer ([place])    Erectile dysfunction    Hypertension    Hypopotassemia    Impotence of organic origin    Inguinal hernia without mention of obstruction or gangrene, unilateral or unspecified, (not specified as recurrent)    Pure hypercholesterolemia    Spermatocele    Urinary reflux    as child      Past Surgical History:  Procedure Laterality Date   ESOPHAGEAL DILATION  03   HERNIA REPAIR     INGUINAL HERNIA REPAIR  10/10/2012   Procedure: LAPAROSCOPIC INGUINAL HERNIA;  Surgeon: Madilyn Hook, DO;  Location: [facility];  Service: General;  Laterality: Right;   INSERTION OF MESH  10/10/2012   Procedure: INSERTION OF MESH;  Surgeon: Madilyn Hook, DO;  Location: [facility];  Service: General;  Laterality: Right;   UPPER GI ENDOSCOPY  03   for food caught in thoat    There were no vitals filed for this visit.    Subjective Assessment - 05/28/21 1244     Subjective [name] is a 65 y.o. male who presents to clinic with chief complaint of foot and ankle pain.  MOI/History of condition: Pt reports that starting roughly 4 months ago he started having a change in his gait pattern (toe  walking) and feeling off balance.  This corresponded to pain in the plantar surface of both feet.  He reports feeling unsteady while walking.  Pain location: bil plantar fascia.  Red flags: yes, festinating gait (forward trunk lean).  48 hour pain intensity:  highest 5/10, current 4/10, best 2/10.  Aggs: walking, standing.  Eases: resting.  Nature: sharp.  Severity: mod.  Irritability: mod.  Stage: chronic.  Stability: staying the same.  24 hour pattern: worse with activity.  Vocation/requirements: delivers paint (heavy lifting).  Functional limitations/goals: work with reduced pain, walk.  Home environment: lives with roommate, 2-3 STE, 14 steps between floors.  Assistive device: none.   Hand dominance: R.  Falls: none.  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      Restrictions   Other Position/Activity Restrictions none      Balance Screen   Has the patient fallen in the past 6 months No      Observation/Other Assessments   Observations festenating gait (mild, with forward trunk lean) mild crossover gait pattern    Focus on Therapeutic Outcomes (FOTO)  next visit      Sensation   Light Touch Appears Intact      Coordination   Gross Motor Movements are Fluid and Coordinated --   RAM of hands shows impariment     ROM / Strength   AROM / PROM / Strength AROM;Strength      AROM   Overall AROM  --   DF measured in W/B with inclinometer below tibial plateau measures 30 degrees bil  (showing impairment in DF ROM)     Strength   Strength Assessment Site Knee;Hip    Right/Left Hip Right;Left    Right Hip Flexion 5/5    Left Hip Flexion 4/5    Right/Left Knee Right;Left    Right Knee Flexion 5/5    Right Knee Extension 5/5    Left Knee Flexion 4/5    Left Knee Extension 4/5      Flexibility   Soft Tissue Assessment /Muscle Length yes    Hamstrings impaired                        Objective measurements completed on examination: See above findings.       [facility] PT Treatment/Exercise - 05/28/21 0001       Balance   Balance Assessed Yes      Standardized Balance Assessment   Standardized Balance Assessment Berg Balance Test      Berg Balance Test   Sit to Stand Able to stand without using hands and stabilize independently    Standing Unsupported Able to stand safely 2 minutes    Sitting with Back Unsupported but Feet Supported on Floor or Stool Able to sit safely and securely 2 minutes    Stand to Sit Sits safely with minimal use of hands    Transfers Able to transfer safely, minor use of hands    Standing Unsupported with Eyes Closed Able to stand 10 seconds safely    Standing Ubsupported with Feet Together Able to place feet together independently and stand 1 minute safely    From Standing, Reach Forward with Outstretched Arm Can reach forward >12 cm safely (5")    From Standing Position, Pick up Object from Floor Able to pick up shoe, needs supervision    From Standing Position, Turn to Look Behind Over each Shoulder Turn sideways only but maintains balance    Turn 360 Degrees Able to turn 360 degrees safely but slowly    Standing Unsupported, Alternately Place Feet on Step/Stool Able to stand independently and complete 8 steps >20 seconds    Standing Unsupported, One Foot in Front Able to plae foot ahead of the other independently and hold 30 seconds    Standing on One Leg Able to lift leg independently and hold > 10 seconds     Total Score 48              Plan - 05/28/21 1249     Clinical Impression Statement [name] is a 65 y.o. male who presents to clinic with signs and sxs  consistent with bil foot pain.  Unfortunately, he presents with some signs consistent with early parkinson's including festinating gait (forward trunk lean), pathological rapid alternating movements, and tremor.  Pt presents with pain and impairments/deficits in: DF ROM, HS flexibility, gait, balance, LE strength.  Activity limitations include: bending, ambulation.  Participation limitations include: work (walking for long periods) and ambulating in community.  Pt will benefit from skilled therapy to address pain and the listed deficits in order to achieve functional goals, enable safety and independence in completion of daily tasks, and return to PLOF.  Pt to be transfered to neuro PT.    Stability/Clinical Decision Making Evolving/Moderate complexity    Clinical Decision Making Moderate    Rehab Potential Fair    PT Frequency 2x / week    PT Duration 8 weeks    PT Treatment/Interventions ADLs/Self Care Home Management;Aquatic Therapy;Iontophoresis '4mg'$ /ml Dexamethasone;Gait training;Therapeutic activities;Therapeutic exercise;Neuromuscular re-education;Manual techniques;Dry needling;Joint Manipulations    PT Next Visit Plan take FOTO, establish appropriate goals, establish HEP    Recommended Other Services contacted reffering provider and will refer to neurology    Consulted and Agree with Plan of Care Patient             Patient will benefit from skilled therapeutic intervention in order to improve the following deficits and impairments:  Abnormal gait, Decreased balance, Decreased mobility, Difficulty walking, Pain, Decreased activity tolerance, Decreased range of motion  Visit Diagnosis: Other abnormalities of gait and mobility - Plan: PT plan of care cert/re-cert  Muscle weakness - Plan: PT plan of care  cert/re-cert  Balance problem - Plan: PT plan of care cert/re-cert  Muscle tightness - Plan: PT plan of care cert/re-cert  Pain in left foot - Plan: PT plan of care cert/re-cert  Pain in right foot - Plan: PT plan of care cert/re-cert     Problem List There are no problems to display for this patient.   Mathis Dad 05/28/2021, 12:57 PM  [facility] [address] [place], [place], [address] Phone: [phone]   Fax:  [phone]  Name: Khadar Conboy MRN: [id number] Date of Birth: 17-May-1956

## 2021-05-30 NOTE — Addendum Note (Signed)
Addended bySherryle Lis, Oaklee Sunga R on: 05/30/2021 07:17 AM   Modules accepted: Orders

## 2021-06-01 ENCOUNTER — Encounter: Payer: Self-pay | Admitting: Neurology

## 2021-06-17 DIAGNOSIS — M5416 Radiculopathy, lumbar region: Secondary | ICD-10-CM | POA: Diagnosis not present

## 2021-06-21 NOTE — Progress Notes (Deleted)
Assessment/Plan:    ***  Subjective:   [name] was seen today in the movement disorders clinic for neurologic consultation at the request of McDonald, Stephan Minister, DPM.  The consultation is for the evaluation of gait instability.  Prior records made available to me are reviewed.  Patient went to podiatry on August 4 complaining about gait change and pain in the feet.  Podiatry sent the patient to physical therapy for pes planus, which was felt to be the source of gait alterations because he could not adequately dorsiflex the ankle.  Patient attended 1 session of physical therapy on August 13.  Physical therapy notes indicate "festinating gait (mild, with forward trunk lean).  Physical therapist felt that patient had symptoms consistent with Parkinson's disease and transferred his therapy over to neuro rehab because of this.  Neurology referral was also recommended, which is the reason patient is here today.   Specific Symptoms:  Tremor: {yes no:314532} Family hx of similar:  {yes no:314532} Voice: *** Sleep: ***  Vivid Dreams:  {yes no:314532}  Acting out dreams:  {yes no:314532} Wet Pillows: {yes no:314532} Postural symptoms:  {yes no:314532}  Falls?  {yes no:314532} Bradykinesia symptoms: {parkinson brady:18041} Loss of smell:  {yes no:314532} Loss of taste:  {yes no:314532} Urinary Incontinence:  {yes no:314532} Difficulty Swallowing:  {yes no:314532} Handwriting, micrographia: {yes no:314532} Trouble with ADL's:  {yes no:314532}  Trouble buttoning clothing: {yes no:314532} Depression:  {yes no:314532} Memory changes:  {yes no:314532} Hallucinations:  {yes no:314532}  visual distortions: {yes no:314532} N/V:  {yes no:314532} Lightheaded:  {yes no:314532}  Syncope: {yes no:314532} Diplopia:  {yes no:314532} Dyskinesia:  {yes no:314532} Prior exposure to reglan/antipsychotics: {yes no:314532}  Neuroimaging of the brain has not previously been performed.    PREVIOUS  MEDICATIONS: {Parkinson's RX:18200}  ALLERGIES:   Allergies  Allergen Reactions   Lipitor [Atorvastatin] Other (See Comments)    Muscle aches    CURRENT MEDICATIONS:  Current Outpatient Medications  Medication Instructions   albuterol (PROVENTIL) 2.5 mg, Every 6 hours PRN   cetirizine (ZYRTEC) 10 mg, Daily PRN   fluticasone (FLONASE) 50 MCG/ACT nasal spray 2 sprays, Daily   fluticasone (FLOVENT HFA) 110 MCG/ACT inhaler 1 puff, 2 times daily   hydrochlorothiazide (HYDRODIURIL) 12.5 mg, Daily   Naphazoline-Glycerin (CLEAR EYES MAX REDNESS RELIEF OP) 1-2 drops, Daily PRN   oxyCODONE (OXY IR/ROXICODONE) 5-10 mg, Oral, Every 4 hours PRN   potassium chloride (K-DUR) 10 MEQ tablet 10 mEq, Daily    Objective:   VITALS:  There were no vitals filed for this visit.  GEN:  The patient appears stated age and is in NAD. HEENT:  Normocephalic, atraumatic.  The mucous membranes are moist. The superficial temporal arteries are without ropiness or tenderness. CV:  RRR Lungs:  CTAB Neck/HEME:  There are no carotid bruits bilaterally.  Neurological examination:  Orientation: The patient is alert and oriented x3.  Cranial nerves: There is good facial symmetry. Extraocular muscles are intact. The visual fields are full to confrontational testing. The speech is fluent and clear. Soft palate rises symmetrically and there is no tongue deviation. Hearing is intact to conversational tone. Sensation: Sensation is intact to light and pinprick throughout (facial, trunk, extremities). Vibration is intact at the bilateral big toe. There is no extinction with double simultaneous stimulation. There is no sensory dermatomal level identified. Motor: Strength is 5/5 in the bilateral upper and lower extremities.   Shoulder shrug is equal and symmetric.  There is no pronator drift. Deep  tendon reflexes: Deep tendon reflexes are 2/4 at the bilateral biceps, triceps, brachioradialis, patella and achilles. Plantar  responses are downgoing bilaterally.  Movement examination: Tone: There is ***tone in the bilateral upper extremities.  The tone in the lower extremities is ***.  Abnormal movements: *** Coordination:  There is *** decremation with RAM's, *** Gait and Station: The patient has *** difficulty arising out of a deep-seated chair without the use of the hands. The patient's stride length is ***.  The patient has a *** pull test.     I have reviewed and interpreted the following labs independently   Chemistry      Component Value Date/Time   NA 139 10/01/2012 1530   K 3.3 (L) 10/01/2012 1530   CL 101 10/01/2012 1530   CO2 26 10/01/2012 1530   BUN 19 10/01/2012 1530   CREATININE 1.23 10/01/2012 1530      Component Value Date/Time   CALCIUM 9.5 10/01/2012 1530      No results found for: TSH Lab Results  Component Value Date   WBC 6.8 10/01/2012   HGB 13.8 10/01/2012   HCT 39.0 10/01/2012   MCV 89.2 10/01/2012   PLT 228 10/01/2012     Total time spent on today's visit was ***60 minutes, including both face-to-face time and nonface-to-face time.  Time included that spent on review of records (prior notes available to me/labs/imaging if pertinent), discussing treatment and goals, answering patient's questions and coordinating care.  Cc:  Alroy Dust, L.Marlou Sa, MD

## 2021-06-22 ENCOUNTER — Ambulatory Visit: Payer: BC Managed Care – PPO | Admitting: Neurology

## 2021-06-22 NOTE — Progress Notes (Signed)
Assessment/Plan:   1.  Gait instability  -Likely multifactorial.  Patient is definitely forward flexed and antalgic when he walks.  However, I am not convinced that this is not primarily orthopedic in nature.  He is scheduled to have back surgery soon, and also relates some of the issues to his hip replacement.  I would like to try to get a copy of his MRI of the lumbar spine, which was done at [facility].  2.  Tremor  -I did not see a lot of tremor today, but the tremor he complains about is really an action tremor.  I did see just a little bit of rest tremor on the right when doing some other tasks.  We talked about doing a DaTscan because of concerns that were brought up about Parkinson's.  He is not sure he is going to be able to take any time off of work right now, but he did sign a form for the [place].  He will let me know if he wants to proceed with that or not.    3.  Because of work issues, he is not sure when he can come back for follow-up.  He is going to call us back and let us know how he would like to proceed.  Subjective:   [name] was seen today in the movement disorders clinic for neurologic consultation at the request of McDonald, Stephan Minister, DPM.  The consultation is for the evaluation of gait instability.  Prior records made available to me are reviewed.  Patient went to podiatry on August 4 complaining about gait change and pain in the feet.  Podiatry sent the patient to physical therapy for pes planus, which was felt to be the source of gait alterations because he could not adequately dorsiflex the ankle.  Patient attended 1 session of physical therapy on August 13.  Physical therapy notes indicate "festinating gait (mild, with forward trunk lean).  Physical therapist felt that patient had symptoms consistent with Parkinson's disease and transferred his therapy over to neuro rehab because of this.  Neurology referral was also recommended, which is the reason  patient is here today.  Pt states that they moved his job at work 1 year ago because of handwriting change.  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   Specific Symptoms:  Tremor: Yes.  , 1 year ago, pt only notes in the R hand but told by the PT that it happens in the L hand as well.  He notes it when using it - writing, brushing teeth, scrambing egg - he is R hand dominant.  Put on primidone for tremor a year ago, 150 mg daily, no help Family hx of similar:  No. Voice: no change Sleep: sleeps well  Vivid Dreams:  No.  Acting out dreams:  No. Wet Pillows: No. Postural symptoms:  Yes.  , relates to hip replacement on the L and back pain (supposed to have back sx by Dr. Lynann Bologna)  Falls?  No. Bradykinesia symptoms: difficulty getting out of a chair (relates to knee); shuffles x 2 years; slower walking Loss of smell:  No. Loss of taste:  No. Urinary Incontinence:  No. Difficulty Swallowing:  No. Handwriting, micrographia: No. But its gotten scratchy Trouble with ADL's:  No.  Trouble buttoning clothing: No. Depression:  No. Memory changes:  No. Hallucinations:  No.  visual distortions: No. N/V:  No. Lightheaded:  No.  Syncope: No. Diplopia:  No. Dyskinesia:  No. Prior exposure to reglan/antipsychotics: No.  Neuroimaging of the brain has not previously been performed.    PREVIOUS MEDICATIONS: none to date  ALLERGIES:   Allergies  Allergen Reactions   Lipitor [Atorvastatin] Other (See Comments)    Muscle aches    CURRENT MEDICATIONS:  Current Outpatient Medications  Medication Instructions   albuterol (PROVENTIL) 2.5 mg, Every 6 hours PRN   cetirizine (ZYRTEC) 10 mg, Daily PRN   fluticasone (FLONASE) 50 MCG/ACT nasal spray 2 sprays, Daily   fluticasone (FLOVENT HFA) 110 MCG/ACT inhaler 1 puff, 2 times daily   hydrochlorothiazide (HYDRODIURIL) 12.5 mg, Daily    Naphazoline-Glycerin (CLEAR EYES MAX REDNESS RELIEF OP) 1-2 drops, Daily PRN   oxyCODONE (OXY IR/ROXICODONE) 5-10 mg, Oral, Every 4 hours PRN   potassium chloride (K-DUR) 10 MEQ tablet 10 mEq, Daily    Objective:   VITALS:   Vitals:   07/06/21 0845  BP: 140/89  Pulse: 85  SpO2: 98%  Weight: 165 lb 9.6 oz (75.1 kg)  Height: '5\' 6"'$  (1.676 m)    GEN:  The patient appears stated age and is in NAD. HEENT:  Normocephalic, atraumatic.  The mucous membranes are moist. The superficial temporal arteries are without ropiness or tenderness. CV:  RRR Lungs:  CTAB Neck/HEME:  There are no carotid bruits bilaterally.  Neurological examination:  Orientation: The patient is alert and oriented x3.  Cranial nerves: There is good facial symmetry. Extraocular muscles are intact. The visual fields are full to confrontational testing. The speech is fluent and clear. Soft palate rises symmetrically and there is no tongue deviation. Hearing is intact to conversational tone. Sensation: Sensation is intact to light and pinprick throughout (facial, trunk, extremities). Vibration is intact at the bilateral big toe. There is no extinction with double simultaneous stimulation. There is no sensory dermatomal level identified. Motor: Strength is 5/5 in the bilateral upper and lower extremities.   Shoulder shrug is equal and symmetric.  There is no pronator drift. Deep tendon reflexes: Deep tendon reflexes are 2/4 at the bilateral biceps, triceps, brachioradialis, patella and achilles. Plantar responses are downgoing bilaterally.  Movement examination: Tone: There is nl tone in the bilateral upper extremities.  The tone in the lower extremities is nl.  Abnormal movements: only R hand tremor noted when asked to do a task with the L hand.  Otherwise, no tremor Coordination:  There is decremation with RAM's, only with finger taps on the L.  All other RAMs are normal bilaterally Gait and Station: The patient has no  difficulty arising out of a deep-seated chair without the use of the hands.  The patient is forward flexed.  He is antalgic.  He has good arm swing.  He does not shuffle. I have reviewed and interpreted the following labs independently   Chemistry      Component Value Date/Time   NA 139 10/01/2012 1530   K 3.3 (L) 10/01/2012 1530   CL 101 10/01/2012 1530   CO2 26 10/01/2012 1530   BUN 19 10/01/2012 1530   CREATININE 1.23 10/01/2012 1530      Component Value Date/Time   CALCIUM 9.5 10/01/2012 1530      No results found for: TSH Lab Results  Component Value Date   WBC 6.8 10/01/2012   HGB 13.8 10/01/2012   HCT 39.0 10/01/2012   MCV 89.2 10/01/2012   PLT 228 10/01/2012  Total time spent on today's visit was 45 minutes, including both face-to-face time and nonface-to-face time.  Time included that spent on review of records (prior notes available to me/labs/imaging if pertinent), discussing treatment and goals, answering patient's questions and coordinating care.  Cc:  Alroy Dust, L.Marlou Sa, MD

## 2021-06-29 DIAGNOSIS — M545 Low back pain, unspecified: Secondary | ICD-10-CM | POA: Diagnosis not present

## 2021-07-05 DIAGNOSIS — M5416 Radiculopathy, lumbar region: Secondary | ICD-10-CM | POA: Diagnosis not present

## 2021-07-06 ENCOUNTER — Encounter: Payer: Self-pay | Admitting: Neurology

## 2021-07-06 ENCOUNTER — Other Ambulatory Visit: Payer: Self-pay

## 2021-07-06 ENCOUNTER — Ambulatory Visit: Payer: BC Managed Care – PPO | Admitting: Neurology

## 2021-07-06 VITALS — BP 140/89 | HR 85 | Ht 66.0 in | Wt 165.6 lb

## 2021-07-06 DIAGNOSIS — R269 Unspecified abnormalities of gait and mobility: Secondary | ICD-10-CM | POA: Diagnosis not present

## 2021-07-06 DIAGNOSIS — R251 Tremor, unspecified: Secondary | ICD-10-CM

## 2021-07-06 NOTE — Patient Instructions (Signed)
I would like to do a DaT scan.  Please call us back and let us know if you would like to proceed.

## 2021-07-07 DIAGNOSIS — S62326A Displaced fracture of shaft of fifth metacarpal bone, right hand, initial encounter for closed fracture: Secondary | ICD-10-CM | POA: Diagnosis not present

## 2021-07-07 DIAGNOSIS — M25531 Pain in right wrist: Secondary | ICD-10-CM | POA: Diagnosis not present

## 2021-07-07 DIAGNOSIS — M79661 Pain in right lower leg: Secondary | ICD-10-CM | POA: Diagnosis not present

## 2021-07-07 DIAGNOSIS — S6991XA Unspecified injury of right wrist, hand and finger(s), initial encounter: Secondary | ICD-10-CM | POA: Diagnosis not present

## 2021-07-07 DIAGNOSIS — M79604 Pain in right leg: Secondary | ICD-10-CM | POA: Diagnosis not present

## 2021-07-07 DIAGNOSIS — Y92411 Interstate highway as the place of occurrence of the external cause: Secondary | ICD-10-CM | POA: Diagnosis not present

## 2021-07-21 ENCOUNTER — Ambulatory Visit: Payer: BC Managed Care – PPO | Admitting: Podiatry

## 2021-07-30 DIAGNOSIS — Z23 Encounter for immunization: Secondary | ICD-10-CM | POA: Diagnosis not present

## 2021-09-12 DIAGNOSIS — M5416 Radiculopathy, lumbar region: Secondary | ICD-10-CM | POA: Diagnosis not present

## 2021-09-13 DIAGNOSIS — M5116 Intervertebral disc disorders with radiculopathy, lumbar region: Secondary | ICD-10-CM | POA: Diagnosis not present

## 2021-09-13 DIAGNOSIS — M5416 Radiculopathy, lumbar region: Secondary | ICD-10-CM | POA: Diagnosis not present

## 2021-10-27 DIAGNOSIS — S39012D Strain of muscle, fascia and tendon of lower back, subsequent encounter: Secondary | ICD-10-CM | POA: Diagnosis not present

## 2021-10-27 DIAGNOSIS — Z9889 Other specified postprocedural states: Secondary | ICD-10-CM | POA: Diagnosis not present

## 2021-11-01 DIAGNOSIS — Z9889 Other specified postprocedural states: Secondary | ICD-10-CM | POA: Diagnosis not present

## 2021-11-01 DIAGNOSIS — S39012D Strain of muscle, fascia and tendon of lower back, subsequent encounter: Secondary | ICD-10-CM | POA: Diagnosis not present

## 2021-11-03 DIAGNOSIS — Z9889 Other specified postprocedural states: Secondary | ICD-10-CM | POA: Diagnosis not present

## 2021-11-03 DIAGNOSIS — S39012D Strain of muscle, fascia and tendon of lower back, subsequent encounter: Secondary | ICD-10-CM | POA: Diagnosis not present

## 2021-11-07 DIAGNOSIS — S39012D Strain of muscle, fascia and tendon of lower back, subsequent encounter: Secondary | ICD-10-CM | POA: Diagnosis not present

## 2021-11-07 DIAGNOSIS — Z9889 Other specified postprocedural states: Secondary | ICD-10-CM | POA: Diagnosis not present

## 2021-11-10 DIAGNOSIS — S39012D Strain of muscle, fascia and tendon of lower back, subsequent encounter: Secondary | ICD-10-CM | POA: Diagnosis not present

## 2021-11-10 DIAGNOSIS — Z9889 Other specified postprocedural states: Secondary | ICD-10-CM | POA: Diagnosis not present

## 2021-11-14 DIAGNOSIS — S39012D Strain of muscle, fascia and tendon of lower back, subsequent encounter: Secondary | ICD-10-CM | POA: Diagnosis not present

## 2021-11-14 DIAGNOSIS — Z9889 Other specified postprocedural states: Secondary | ICD-10-CM | POA: Diagnosis not present

## 2021-11-16 DIAGNOSIS — S39012D Strain of muscle, fascia and tendon of lower back, subsequent encounter: Secondary | ICD-10-CM | POA: Diagnosis not present

## 2021-11-16 DIAGNOSIS — Z9889 Other specified postprocedural states: Secondary | ICD-10-CM | POA: Diagnosis not present

## 2021-11-17 DIAGNOSIS — E7800 Pure hypercholesterolemia, unspecified: Secondary | ICD-10-CM | POA: Diagnosis not present

## 2021-11-17 DIAGNOSIS — Z125 Encounter for screening for malignant neoplasm of prostate: Secondary | ICD-10-CM | POA: Diagnosis not present

## 2021-11-17 DIAGNOSIS — Z0000 Encounter for general adult medical examination without abnormal findings: Secondary | ICD-10-CM | POA: Diagnosis not present

## 2021-11-17 DIAGNOSIS — I10 Essential (primary) hypertension: Secondary | ICD-10-CM | POA: Diagnosis not present

## 2021-11-21 DIAGNOSIS — Z9889 Other specified postprocedural states: Secondary | ICD-10-CM | POA: Diagnosis not present

## 2021-11-21 DIAGNOSIS — S39012D Strain of muscle, fascia and tendon of lower back, subsequent encounter: Secondary | ICD-10-CM | POA: Diagnosis not present

## 2021-11-23 DIAGNOSIS — S39012D Strain of muscle, fascia and tendon of lower back, subsequent encounter: Secondary | ICD-10-CM | POA: Diagnosis not present

## 2021-11-23 DIAGNOSIS — Z9889 Other specified postprocedural states: Secondary | ICD-10-CM | POA: Diagnosis not present

## 2022-01-12 DIAGNOSIS — S62326D Displaced fracture of shaft of fifth metacarpal bone, right hand, subsequent encounter for fracture with routine healing: Secondary | ICD-10-CM | POA: Diagnosis not present

## 2022-05-25 DIAGNOSIS — J4530 Mild persistent asthma, uncomplicated: Secondary | ICD-10-CM | POA: Diagnosis not present

## 2022-05-25 DIAGNOSIS — J309 Allergic rhinitis, unspecified: Secondary | ICD-10-CM | POA: Diagnosis not present

## 2022-05-25 DIAGNOSIS — I10 Essential (primary) hypertension: Secondary | ICD-10-CM | POA: Diagnosis not present

## 2022-05-25 DIAGNOSIS — E7800 Pure hypercholesterolemia, unspecified: Secondary | ICD-10-CM | POA: Diagnosis not present

## 2022-05-26 DIAGNOSIS — N509 Disorder of male genital organs, unspecified: Secondary | ICD-10-CM | POA: Diagnosis not present

## 2022-05-26 DIAGNOSIS — N4342 Spermatocele of epididymis, multiple: Secondary | ICD-10-CM | POA: Diagnosis not present

## 2022-05-26 DIAGNOSIS — N503 Cyst of epididymis: Secondary | ICD-10-CM | POA: Diagnosis not present

## 2022-06-07 ENCOUNTER — Ambulatory Visit: Payer: Self-pay

## 2022-06-07 NOTE — Patient Instructions (Signed)
Visit Information Thank you for allowing the Care Management team to participate in your care. It was great speaking with you today! Please do not hesitate to call if you require assistance prior to our next outreach.  Following are the goals we discussed today:   Goals Addressed             This Visit's Progress    Health Maintenance       Care Coordination Interventions: Reviewed plan for disease management. Reports adhering to plan and attending medical appointments as scheduled. Reports a pending scrotal ultrasound on 06/21/22. Reviewed medications. Reports managing well. Denies concerns r/t medication management or prescription cost. AWV up to date.  Last completed on 11/17/2021.        Our next appointment is by telephone on December 01, 2021 at 1:30. Please call the care guide team at [phone] if you need to cancel or reschedule your appointment.    Mr. Mander verbalized understanding of information discussed during the telephonic outreach. Declined need for mailed instructions or resources.    A member of the care management team will follow up in 6 months.  [facility] [phone]

## 2022-06-07 NOTE — Patient Outreach (Signed)
  Care Coordination   Initial Visit Note   06/07/2022 Name: [name] MRN: [id number] DOB: Mar 04, 1956  [name] is a 66 y.o. year old male who sees [place]u [place], L.Marlou Sa, MD for primary care. I spoke with  [name] by phone today  What matters to the patients health and wellness today?  Health Maintenance    Goals Addressed             This Visit's Progress    Health Maintenance       Care Coordination Interventions: Reviewed plan for disease management. Reports adhering to plan and attending medical appointments as scheduled. Reports a pending scrotal ultrasound on 06/21/22. Reviewed medications. Reports managing well. Denies concerns r/t medication management or prescription cost. AWV up to date.  Last completed on 11/17/2021.        SDOH assessments and interventions completed:  Yes  SDOH Interventions Today    Flowsheet Row Most Recent Value  SDOH Interventions   Food Insecurity Interventions Intervention Not Indicated  Transportation Interventions Intervention Not Indicated        Care Coordination Interventions Activated:  Yes  Care Coordination Interventions:  Yes, provided   Follow up plan: Follow up call scheduled for December 01, 2021.    Encounter Outcome:  Pt. Visit Completed   [facility] [phone]

## 2022-06-16 DIAGNOSIS — L84 Corns and callosities: Secondary | ICD-10-CM | POA: Diagnosis not present

## 2022-06-20 DIAGNOSIS — M5451 Vertebrogenic low back pain: Secondary | ICD-10-CM | POA: Diagnosis not present

## 2022-06-21 DIAGNOSIS — N433 Hydrocele, unspecified: Secondary | ICD-10-CM | POA: Diagnosis not present

## 2022-06-21 DIAGNOSIS — N509 Disorder of male genital organs, unspecified: Secondary | ICD-10-CM | POA: Diagnosis not present

## 2022-06-27 DIAGNOSIS — M170 Bilateral primary osteoarthritis of knee: Secondary | ICD-10-CM | POA: Diagnosis not present

## 2022-06-27 DIAGNOSIS — M1712 Unilateral primary osteoarthritis, left knee: Secondary | ICD-10-CM | POA: Diagnosis not present

## 2022-06-27 DIAGNOSIS — M1711 Unilateral primary osteoarthritis, right knee: Secondary | ICD-10-CM | POA: Diagnosis not present

## 2022-07-06 ENCOUNTER — Encounter: Payer: Self-pay | Admitting: Podiatry

## 2022-07-06 ENCOUNTER — Ambulatory Visit: Payer: BC Managed Care – PPO | Admitting: Podiatry

## 2022-07-06 DIAGNOSIS — Q828 Other specified congenital malformations of skin: Secondary | ICD-10-CM | POA: Diagnosis not present

## 2022-07-06 DIAGNOSIS — M2141 Flat foot [pes planus] (acquired), right foot: Secondary | ICD-10-CM

## 2022-07-06 DIAGNOSIS — M7741 Metatarsalgia, right foot: Secondary | ICD-10-CM

## 2022-07-06 DIAGNOSIS — M2142 Flat foot [pes planus] (acquired), left foot: Secondary | ICD-10-CM | POA: Diagnosis not present

## 2022-07-06 DIAGNOSIS — M7742 Metatarsalgia, left foot: Secondary | ICD-10-CM

## 2022-07-07 NOTE — Progress Notes (Signed)
Subjective:   Patient ID: [name], male   DOB: 66 y.o.   MRN: [id number]   HPI Patient presents with feet pain bilateral that is just generalized with inflammation that makes it hard for him to walk at times   ROS      Objective:  Physical Exam  Neurovascular status intact with lesions that are secondary to foot structure with inflammation present no obvious swelling occurring and pain in generalized condition      Assessment:  Keratotic tissue formation with inflammatory condition and lesion formation     Plan:  Debridement of lesions accomplished no iatrogenic bleeding discussed this condition with him the consideration for possible metatarsal osteotomy or head resection if symptoms persist to a quicker degree and he will be seen back when symptomatic

## 2022-07-15 DIAGNOSIS — Z23 Encounter for immunization: Secondary | ICD-10-CM | POA: Diagnosis not present

## 2022-07-18 NOTE — Progress Notes (Unsigned)
Assessment/Plan:   1.  Parkinsons Disease  -Patient looks significantly worse than he did a year ago, and does meet criteria for Parkinsons disease, although atypical state cannot be ruled out.  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  He was given a titration schedule.  He will start carbidopa/levodopa 25/100 and slowly work to 1 tablet at 6 AM/10 to 11 AM/3 to 4 PM.  -We will wean him off of primidone.  He is currently on 250 mg, so this will take it while.  He was given a weaning schedule.  The weaning schedule was as follows: Decrease your primidone 250 mg, 1/2 tablet twice per day for a week.  Then fill the 50 mg tablet and take, 2 in the AM and 2 at bed for a week, then 1 in the AM and 2 at bed for a week, and then 1 in the AM and 1 at bed for a week and then 1 daily for a week and then stop primidone  -Talk to him about the importance of exercise.  I really would like to see him in a physical therapy program.  His work schedule is difficult, so I told him I would like to fill out FMLA forms so that perhaps he could do physical therapy, even if it was late in the day.  He is going to look into that and will let me know.  He does plan to retire mid next year.  -He was given information on exercise programs around the city.   Subjective:   [name] was seen today in follow up.  He was sent here to rule out Parkinsons disease.  I saw him just over a year ago and did not see any evidence of this.   While we did note that he was forward flexed with ambulation, he met no criteria for Parkinsons at that time.  He declined DaTscan and did not want to follow-up (his work schedule was inflexible).  We felt  that the gait instability was multifactorial.  Patient was getting ready to undergo back surgery and related some of his antalgic gait to hip replacement.  Because of his work issues, he did not want to schedule regular follow-up and did not want to proceed with any further testing, but was referred back today for further evaluation.  Orthopedics noted that he had a slow, shuffling gait and needed further evaluation before they wanted to proceed with any further knee evaluation.  Pt states that he has had only one fall - he was pulling the power jack back and fell.  The L hand is shaking some.  His pcp put him on primidone and he is on 250 mg daily.  It is not helping.    ALLERGIES:   Allergies  Allergen Reactions   Lipitor [Atorvastatin] Other (See Comments)    Muscle aches    CURRENT MEDICATIONS:  Current Meds  Medication Sig   albuterol (PROVENTIL) (2.5 MG/3ML) 0.083% nebulizer solution Take 2.5 mg by nebulization every 6 (six) hours as needed.   amLODipine (NORVASC) 10 MG tablet Take 10 mg by mouth daily.   budesonide-formoterol (SYMBICORT) 160-4.5 MCG/ACT inhaler Inhale 2 puffs  into the lungs 2 (two) times daily.   fluticasone (FLONASE) 50 MCG/ACT nasal spray Place 2 sprays into the nose daily.   fluticasone (FLOVENT HFA) 110 MCG/ACT inhaler Inhale 1 puff into the lungs 2 (two) times daily.   hydrochlorothiazide (HYDRODIURIL) 25 MG tablet Take 12.5 mg by mouth daily.    lisinopril (ZESTRIL) 40 MG tablet Take 40 mg by mouth daily.   methocarbamol (ROBAXIN) 500 MG tablet Take 500-1,000 mg by mouth every 6 (six) hours as needed.   Naphazoline-Glycerin (CLEAR EYES MAX REDNESS RELIEF OP) Place 1-2 drops into both eyes daily as needed. For red eyes   [DISCONTINUED] potassium chloride (K-DUR) 10 MEQ tablet Take 10 mEq by mouth daily.      Objective:   PHYSICAL EXAMINATION:    VITALS:   Vitals:   07/20/22 1528  BP: (!) 168/97  Pulse: 74    GEN:  The patient appears stated age and  is in NAD. HEENT:  Normocephalic, atraumatic.  The mucous membranes are moist. The superficial temporal arteries are without ropiness or tenderness. CV:  RRR Lungs:  CTAB Neck/HEME:  There are no carotid bruits bilaterally.  Neurological examination:  Orientation: The patient is alert and oriented x3. Cranial nerves: There is good facial symmetry with significant facial hypomimia.  Extraocular muscles are intact.  The speech is fluent and clear.  There is some pseudobulbar laughter.  Soft palate rises symmetrically and there is no tongue deviation. Hearing is intact to conversational tone. Sensation: Sensation is intact to light touch throughout Motor: Strength is at least antigravity x4.  Movement examination: Tone: There is mod increased tone in the LLE but nl in the LUE and RUE/RLE Abnormal movements: There is mild LUE rest tremor Coordination:  There is decremation with RAM's, with any form of RAMS, including alternating supination and pronation of the forearm, hand opening and closing, finger taps, heel taps and toe taps, mild on the L Gait and Station: The patient has trouble arising out of the chair.  He is forward flexed and very unstable.    I have reviewed and interpreted the following labs independently    Chemistry      Component Value Date/Time   NA 139 10/01/2012 1530   K 3.3 (L) 10/01/2012 1530   CL 101 10/01/2012 1530   CO2 26 10/01/2012 1530   BUN 19 10/01/2012 1530   CREATININE 1.23 10/01/2012 1530      Component Value Date/Time   CALCIUM 9.5 10/01/2012 1530       Lab Results  Component Value Date   WBC 6.8 10/01/2012   HGB 13.8 10/01/2012   HCT 39.0 10/01/2012   MCV 89.2 10/01/2012   PLT 228 10/01/2012    No results found for: "TSH"   Total time spent on today's visit was 57minutes, including both face-to-face time and nonface-to-face time.  Time included that spent on review of records (prior notes available to me/labs/imaging if pertinent),  discussing treatment and goals, answering patient's questions and coordinating care.  Cc:  Alroy Dust, L.Marlou Sa, MD

## 2022-07-20 ENCOUNTER — Encounter: Payer: Self-pay | Admitting: Neurology

## 2022-07-20 ENCOUNTER — Ambulatory Visit: Payer: BC Managed Care – PPO | Admitting: Neurology

## 2022-07-20 VITALS — BP 168/97 | HR 74 | Ht 67.0 in | Wt 170.8 lb

## 2022-07-20 DIAGNOSIS — G20A1 Parkinson's disease without dyskinesia, without mention of fluctuations: Secondary | ICD-10-CM | POA: Diagnosis not present

## 2022-07-20 DIAGNOSIS — R270 Ataxia, unspecified: Secondary | ICD-10-CM

## 2022-07-20 MED ORDER — CARBIDOPA-LEVODOPA 25-100 MG PO TABS: 1.0000 | ORAL_TABLET | Freq: Three times a day (TID) | ORAL | 1 refills | Status: DC

## 2022-07-20 MED ORDER — PRIMIDONE 50 MG PO TABS: ORAL_TABLET | ORAL | 0 refills | Status: AC

## 2022-07-20 NOTE — Patient Instructions (Addendum)
Start Carbidopa Levodopa as follows: Take 1/2 tablet three times daily, at least 30 minutes before meals (approximately 6am/10-11am/3-4pm), for one week Then take 1/2 tablet in the morning, 1/2 tablet in the afternoon, 1 tablet in the evening, at least 30 minutes before meals, for one week Then take 1/2 tablet in the morning, 1 tablet in the afternoon, 1 tablet in the evening, at least 30 minutes before meals, for one week Then take 1 tablet three times daily at 6am/10-11am/3-4pm, at least 30 minutes before meals   As a reminder, carbidopa/levodopa can be taken at the same time as a carbohydrate, but we like to have you take your pill either 30 minutes before a protein source or 1 hour after as protein can interfere with carbidopa/levodopa absorption.  Decrease your primidone 250 mg, 1/2 tablet twice per day for a week.  Then fill the 50 mg tablet and take, 2 in the AM and 2 at bed for a week, then 1 in the AM and 2 at bed for a week, and then 1 in the AM and 1 at bed for a week and then 1 daily for a week and then stop primidone  A referral to [facility] has been placed for your MRI someone will contact you directly to schedule your appt. They are located at [place]. Please contact them directly by calling [phone] with any questions regarding your referral.  Local and Online Resources for Power over Parkinson's Group September 2023  [place] over Parkinson's Group:   Power Over Parkinson's Patient Education Group will be Wednesday, September 13th-*Hybrid meting*- in person at [facility] location and via [facility] at 2 pm.   Upcoming Power over [employer] Meetings:  2nd Wednesdays of the month at 2 pm:  September 13th, October 11th, November 8th Contact Amy Marriott at [email or web address] if interested in participating in this group Parkinson's Care Partners Group:    3rd Mondays, Contact Misty Paladino Atypical  Parkinsonian Patient Group:   4th Wednesdays, [place] If you are interested in participating in these groups with Misty, please contact her directly for how to join those meetings.  Her contact information is [email or web address].    LOCAL EVENTS AND NEW OFFERINGS New PWR! Moves [employer]negy Instructor-Led Classes offering at [employer]!  Wednesdays 1-2 pm.   Contact Vonna Kotyk at  [email or web address] or Caron Presume at [place], [email or web address] Dance for Parkinson 's classes will be on Tuesdays 9:30am-10:30am starting October 3-December 12 with a break the week of November 21 . Located in the [employer]  which is in the first floor of the [employer] ([employer] for Parkinson's will be held on 2nd and 4th Mondays at 11:00am . First class will start  September 25th.  Located at the [place] ([place].) Through support from the [place] and Drumming for Parkinson's classes are free for both patients and caregivers.  Contact Misty Taylor-Paladino for more details about registering.  [place]:  www.[email or web address] PD Health at Home continues:  Mindfulness Mondays, Wellness Wednesdays, Fitness Fridays  Upcoming Education:   Navigating Nutrition with PD.  Wednesday, Sept. 6th 1:00-2:00 pm Understanding Mind and Memory.  Wednesday, Sept. 20th 1:00-2:00 pm  Expert Briefing:    Parkinson's Disease and the Bladder.  Wednesday, Sept. 13th 1:00-2:00 pm Parkinson's and the Gut-Brain Connection.  Wednesday, Oct. 11th 1:00-2:00  pm Register for expert briefings (webinars) at [email or web address] Please check out their website to sign up for emails and see their full online offerings   [place]:   [email or web address]  Third Thursday Webinars:  On the third Thursday of every month at 12 p.m. ET, join our free live webinars to learn about various aspects of living with Parkinson's disease and our work to speed medical breakthroughs. Upcoming Webinar:  Stay tuned Check out additional information on their website to see their full online offerings  [employer]:  [email or web address] Upcoming Webinar:   Stay tuned Webinar Series:  Living with Parkinson's Meetup.   Third Thursdays each month, 3 pm Care Partner Monthly Meetup.  With Robin Searing Phinney.  First Tuesday of each month, 2 pm Check out additional information to Live Well Today on their website  Parkinson and Movement Disorders (PMD) Alliance:  www.[email or web address] [employer] Online:  Online Education Events Sign up for emails, which are sent weekly to give you updates on programming and online offerings  Parkinson's Association of the [place]:  www.[email or web address] Information on online support groups, education events, and online exercises including Yoga, Parkinson's exercises and more-LOTS of information on links to PD resources and online events Virtual Support Group through Parkinson's Association of the [place]; next one is scheduled for Wednesday, October 4th at 2 pm. (No September meeting due to the symposium.  These are typically scheduled for the 1st Wednesday of the month at 2 pm).  Visit website for details. Register for "Caring for Parkinson's-Caring for You", 9th Annual Symposium.  In-person event in [place].  September 9th.  To register:  [email or web address]_[email or web address] MOVEMENT AND EXERCISE OPPORTUNITIES PWR! Moves Classes at [place].  Wednesdays 10 and 11 am.   Contact Amy Marriott, PT [email or web address] if interested. NEW PWR! Moves Class offerings at [employer].  Wednesdays 1-2 pm.  Contact Vonna Kotyk at   [email or web address] or Caron Presume at [place],  [email or web address] Parkinson's Wellness Recovery (PWR! Moves)  www.[email or web address] Info on the PWR! Virtual Experience:  You will have access to our expertise through self-assessment, guided plans that start with the PD-specific fundamentals, educational content, tips, Q&A with an expert, and a growing [employer] Group of PD-specific pre-recorded and live exercise classes of varying types and intensity - both physical and cognitive! If that is not enough, we offer 1:1 wellness consultations (in-person or virtual) to personalize your PWR! Art therapist.  [place] Fridays:  As part of the PD Health @ Home program, this free video series focuses each week on one aspect of fitness designed to support people living with Parkinson's.  These weekly videos highlight the [place] recent fitness guidelines for people with Parkinson's disease. [address] Dance for PD website is offering free, live-stream classes throughout the week, as well as links to [email or web address].si of classes:  [email or web address] Virtual dance and Pilates for Parkinson's classes: Click on the Community Tab> Parkinson's Movement Initiative Tab.  To register for classes and for more information, visit www.[employer] and click the "community" tab.  [employer] Parkinson's Cycling Classes  [employer]:  Thursdays @ Noon-Live classes at 11-30-1985 ([employer] at [email or web address] or [phone]) [employer]: Virtual Classes Mondays and Thursdays 11-30-1985 classes Tuesday, Wednesday and Thursday (contact [place] at [email or web address]  or [phone]) [facility] Varied levels of classes are offered Tuesdays and Thursdays at [employer].  Stretching with [facility] weekly class is also offered for people with  Parkinson's To observe a class or for more information,  call [phone] or email Hezzie Bump at [email or web address] ADDITIONAL SUPPORT AND RESOURCES Well[employer]Spring [employer]:Online Caregiver Education Opportunities:  www.well-[email or web address]/caregiver-education/caregiver-support-group.  You may also contact Vickki Muff at [email or web address] or [phone].    Coping with Difficult Caregiver Emotions.  Wednesday, September 20th, 10:30 am-12.  The [facility], [facility] Collective Navigating the [name] of Senior Care Options.  Thursday, September 28th, 4-5:15 pm.  The [facility]. Well-Spring Navigator:  [facility] program, a free service to help individuals and families through the journey of determining care for older adults.  The "Navigator" is a [employer], Education officer, museum, who will speak with a prospective client and/or loved ones to provide an assessment of the situation and a set of recommendations for a personalized care plan -- all free of charge, and whether Well[employer]Spring [employer] offers the needed service or not. If the need is not a service we provide, we are well-connected with reputable programs in town that we can refer you to.  www.well-springsolutions.org or to speak with the Navigator, call [phone].

## 2022-08-05 ENCOUNTER — Ambulatory Visit
Admission: RE | Admit: 2022-08-05 | Discharge: 2022-08-05 | Disposition: A | Discharge status: Home / Self Care | Payer: BC Managed Care – PPO | Source: Ambulatory Visit | Attending: Neurology | Admitting: Neurology

## 2022-08-05 DIAGNOSIS — R270 Ataxia, unspecified: Secondary | ICD-10-CM | POA: Diagnosis not present

## 2022-08-09 ENCOUNTER — Other Ambulatory Visit: Payer: Self-pay | Admitting: Neurology

## 2022-10-27 NOTE — Progress Notes (Deleted)
Assessment/Plan:   1.  Parkinsons Disease  -Patient looks significantly worse than he did a year ago, and does meet criteria for Parkinsons disease, although atypical state cannot be ruled out.  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  He was given a titration schedule.  He will start carbidopa/levodopa 25/100 and slowly work to 1 tablet at 6 AM/10 to 11 AM/3 to 4 PM.  -We will wean him off of primidone.  He is currently on 250 mg, so this will take it while.  He was given a weaning schedule.  The weaning schedule was as follows: Decrease your primidone 250 mg, 1/2 tablet twice per day for a week.  Then fill the 50 mg tablet and take, 2 in the AM and 2 at bed for a week, then 1 in the AM and 2 at bed for a week, and then 1 in the AM and 1 at bed for a week and then 1 daily for a week and then stop primidone  -Talk to him about the importance of exercise.  I really would like to see him in a physical therapy program.  His work schedule is difficult, so I told him I would like to fill out FMLA forms so that perhaps he could do physical therapy, even if it was late in the day.  He is going to look into that and will let me know.  He does plan to retire mid next year.  -He was given information on exercise programs around the city.   Subjective:   [name] was seen today in follow up.  When I saw him last visit, I diagnosed him with Parkinson's disease, but felt atypical state cannot be ruled out.  We did start him on levodopa and he reports that ***.  MRI brain was completed and reviewed.  This demonstrated  moderate small vessel disease.  I weaned him off primidone, which took a long time because he was on  a large dose.  I talked to him about the importance of exercise and physical therapy, which was declined because of his work schedule.  I did tell him I would fill out FMLA so that he could do this and he was going to look into that through work.   Current movement disorder medications: Carbidopa/levodopa 25/100, 1 tablet 3 times per day  Prior medications: Primidone   ALLERGIES:   Allergies  Allergen Reactions   Lipitor [Atorvastatin] Other (See Comments)    Muscle aches    CURRENT MEDICATIONS:  No outpatient medications have been marked as taking for the 10/30/22 encounter (Appointment) with Xan Ingraham, Eustace Quail, DO.     Objective:   PHYSICAL EXAMINATION:    VITALS:   There were no vitals filed for this visit.   GEN:  The patient appears stated age and is in NAD. HEENT:  Normocephalic, atraumatic.  The mucous membranes are moist. The superficial temporal arteries are without ropiness or tenderness. CV:  RRR Lungs:  CTAB Neck/HEME:  There are no carotid bruits bilaterally.  Neurological examination:  Orientation: The patient is alert and oriented x3. Cranial nerves: There is good facial symmetry with significant facial hypomimia.  Extraocular  muscles are intact.  The speech is fluent and clear.  There is some pseudobulbar laughter.  Soft palate rises symmetrically and there is no tongue deviation. Hearing is intact to conversational tone. Sensation: Sensation is intact to light touch throughout Motor: Strength is at least antigravity x4.  Movement examination: Tone: There is mod increased tone in the LLE but nl in the LUE and RUE/RLE Abnormal movements: There is mild LUE rest tremor Coordination:  There is decremation with RAM's, with any form of RAMS, including alternating supination and pronation of the forearm, hand opening and closing, finger taps, heel taps and toe taps, mild on the L Gait and Station: The patient has trouble arising out of the chair.  He is forward  flexed and very unstable.    I have reviewed and interpreted the following labs independently    Chemistry      Component Value Date/Time   NA 139 10/01/2012 1530   K 3.3 (L) 10/01/2012 1530   CL 101 10/01/2012 1530   CO2 26 10/01/2012 1530   BUN 19 10/01/2012 1530   CREATININE 1.23 10/01/2012 1530      Component Value Date/Time   CALCIUM 9.5 10/01/2012 1530       Lab Results  Component Value Date   WBC 6.8 10/01/2012   HGB 13.8 10/01/2012   HCT 39.0 10/01/2012   MCV 89.2 10/01/2012   PLT 228 10/01/2012    No results found for: "TSH"   Total time spent on today's visit was ***minutes, including both face-to-face time and nonface-to-face time.  Time included that spent on review of records (prior notes available to me/labs/imaging if pertinent), discussing treatment and goals, answering patient's questions and coordinating care.  Cc:  Alroy Dust, L.Marlou Sa, MD

## 2022-10-30 ENCOUNTER — Ambulatory Visit: Payer: BC Managed Care – PPO | Admitting: Neurology

## 2022-10-30 ENCOUNTER — Telehealth: Payer: Self-pay

## 2022-10-30 NOTE — Telephone Encounter (Signed)
Called patient Dr. Carles Collet would like to work this pateint in on Friday the 19th at 1:30 or Monday the 22nd at 8:45

## 2022-11-01 NOTE — Progress Notes (Signed)
Assessment/Plan:   1.  Parkinsons Disease  -marked improvement on carbidopa/levodopa.  No longer with rigidity  -pt did not think that he had improvement b/c still had tremor but discussed levodopa resistant tremor, although he didn't have a lot of tremor.  However, he did want to trial pramipexole.   I was agreeable but discussed r/b/se, including but not limited to sleep attacks.  We will slowly work to 0.5 mg tid.    -talked to him about fmla.    -so very proud of him.  He is exercising and has completely stopped drinking  2.  F/u 5 months   Subjective:   [name] was seen today in follow up.  When I saw him last visit, I diagnosed him with Parkinson's disease, but felt atypical state cannot be ruled out.  We did start him on levodopa and he reports that the medication doesn't seem to be helping.  He is going to the gym now and is eating better and has quit drinking.  MRI brain was completed and reviewed.  This demonstrated  moderate small vessel disease.  I weaned him off primidone, which took a long time because he was on a large dose.  I talked to him about the importance of exercise and physical therapy, which was declined because of his work schedule.  I did tell him I would fill out FMLA so that he could do this and he was going to look into that through work.  He reports that work demoted him because they could not read his writing.  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     Current movement disorder medications: Carbidopa/levodopa 25/100, 1 tablet 3 times per day (6am/11:30pm/5pm)  Prior medications: Primidone   ALLERGIES:   Allergies  Allergen Reactions   Lipitor [Atorvastatin] Other (See Comments)    Muscle aches    CURRENT MEDICATIONS:  Current Meds  Medication Sig   albuterol (PROVENTIL) (2.5 MG/3ML) 0.083% nebulizer solution Take 2.5 mg by nebulization every 6 (six) hours as needed.   amLODipine (NORVASC) 10 MG tablet Take 10 mg by mouth daily.    budesonide-formoterol (SYMBICORT) 160-4.5 MCG/ACT inhaler Inhale 2 puffs into the lungs 2 (two) times daily.   carbidopa-levodopa (SINEMET IR) 25-100 MG tablet Take 1 tablet by mouth 3 (three) times daily. 6am, 10-11am, 3-4pm   hydrochlorothiazide (HYDRODIURIL) 25 MG tablet Take 12.5 mg by mouth daily.    lisinopril (ZESTRIL) 40 MG tablet Take 40 mg by mouth daily.   methocarbamol (ROBAXIN) 500 MG tablet Take 500-1,000 mg by mouth every 6 (six) hours as needed.   Naphazoline-Glycerin (CLEAR EYES MAX REDNESS RELIEF OP) Place 1-2 drops into both eyes daily as needed. For red eyes   primidone (MYSOLINE) 50 MG tablet 2 po bid as directed     Objective:   PHYSICAL EXAMINATION:    VITALS:   Vitals:   11/03/22 1323  BP: (!) 141/92  Pulse: 71  SpO2: 98%  Weight: 175 lb 9.6 oz (79.7 kg)  Height: '5\' 6"'$  (1.676 m)     GEN:  The patient appears stated age and is in NAD. HEENT:  Normocephalic, atraumatic.  The mucous membranes are moist. The superficial temporal arteries are without ropiness or tenderness. CV:  RRR Lungs:  CTAB Neck/HEME:  There are no carotid bruits bilaterally.  Neurological examination:  Orientation: The patient is alert and oriented x3. Cranial nerves: There is good facial symmetry with significant facial hypomimia.  Extraocular muscles are  intact.  The speech is fluent and clear.  Soft palate rises symmetrically and there is no tongue deviation. Hearing is intact to conversational tone. Sensation: Sensation is intact to light touch throughout Motor: Strength is at least antigravity x4.  Movement examination: Tone: There is nl tone in the UE/LE (marked improvement) Abnormal movements: There is rare LUE rest tremor Coordination:  There is no decremation with any form of RAMS, including alternating supination and pronation of the forearm, hand opening and closing, finger taps, heel taps and toe taps bilaterally Gait and Station: The patient has no trouble arising out of  the chair.  He is forward flexed and he does festinate.  He is just mildly unstable  I have reviewed and interpreted the following labs independently    Chemistry      Component Value Date/Time   NA 139 10/01/2012 1530   K 3.3 (L) 10/01/2012 1530   CL 101 10/01/2012 1530   CO2 26 10/01/2012 1530   BUN 19 10/01/2012 1530   CREATININE 1.23 10/01/2012 1530      Component Value Date/Time   CALCIUM 9.5 10/01/2012 1530       Lab Results  Component Value Date   WBC 6.8 10/01/2012   HGB 13.8 10/01/2012   HCT 39.0 10/01/2012   MCV 89.2 10/01/2012   PLT 228 10/01/2012    No results found for: "TSH"   Total time spent on today's visit was 22mnutes, including both face-to-face time and nonface-to-face time.  Time included that spent on review of records (prior notes available to me/labs/imaging if pertinent), discussing treatment and goals, answering patient's questions and coordinating care.  Cc:  MAlroy Dust L.DMarlou Sa MD

## 2022-11-03 ENCOUNTER — Ambulatory Visit: Payer: BC Managed Care – PPO | Admitting: Neurology

## 2022-11-03 ENCOUNTER — Encounter: Payer: Self-pay | Admitting: Neurology

## 2022-11-03 VITALS — BP 136/90 | HR 71 | Ht 66.0 in | Wt 175.6 lb

## 2022-11-03 DIAGNOSIS — G20A1 Parkinson's disease without dyskinesia, without mention of fluctuations: Secondary | ICD-10-CM

## 2022-11-03 MED ORDER — PRAMIPEXOLE DIHYDROCHLORIDE 0.5 MG PO TABS: 0.5000 mg | ORAL_TABLET | Freq: Three times a day (TID) | ORAL | 1 refills | Status: DC

## 2022-11-03 MED ORDER — PRAMIPEXOLE DIHYDROCHLORIDE 0.125 MG PO TABS: ORAL_TABLET | ORAL | 0 refills | Status: DC

## 2022-11-03 NOTE — Patient Instructions (Addendum)
Start mirapex (pramipexole) as follows:  0.125 mg - 1 tablet three times per day for a week, then 2 tablets three times per day for a week and then fill the 0.5 mg tablet and take that, 1 pill three times per day   You will continue carbidopa/levodopa 25/100, 1 tablet three times per day, as you have been taking them  Local and Online Resources for Power over Parkinson's Group  January 2024   [place] over Parkinson's Group:    Power Over Parkinson's Patient Education Group will be Wednesday, January 10th-*Hybrid meting*- in person at [facility] location and via [facility], 2:00-3:00 pm.   Starting in November, Power over [employer] and Care Partner Groups will meet together, with plans for separate break out session for caregivers (*this will be evolving over the next few months) Upcoming Power over Parkinson's Meetings/Care Partner Support:  2nd Wednesdays of the month at 2 pm:   January 10th, February 14th [place] at [email or web address] if interested in participating in this group    New [place]! Moves Dynegy Instructor-Led Classes offering at [employer]!  TUESDAYS and Wednesdays 1-2 pm.   Contact Vonna Kotyk at  [email or web address]  or [phone] (Tuesday classes are modified for chair and standing only) Drumming for Parkinson's will be held on 2nd and 4th Mondays at 11:00 am.   Located at the [place]  Contact Doylene Canning at [email or web address] or Sanctuary Class, Mondays at 11 am.  Call [phone] for details Let's Try Pickleball-$25 for 6 weeks of Pickleball in [place].  Contact Corwin Levins for more details and for dates.  [email or web address] SAVE THE DATE and REGISTER:  [place] Chapter of Parkinson's Foundation:  Parkinson's Symposium.   Conversations about Parkinson's.  Saturday, December 16, 2022, 9:00 am-2:00 pm.  [place], *In person or online via [place]*.  Register at [email or web address] or call Beverlee Nims at [phone].   [place]:  [email or web address]  PD Health at Home continues:  Mindfulness Mondays, Wellness Wednesdays, Fitness Fridays   Upcoming Education:    Environmental health practitioner your Voice:  Air cabin crew. Wednesday, January 3rd,  1-2 pm  Managing Weight Loss & Retaining Muscle Mass.  Wednesday, Jan 10th, 1-2 pm Changes in Speech and Voice.  Wednesday, January 17th, 1-2 pm Register for virtual education and Patent attorney (webinars) at [email or web address].pl Please check out their website to sign up for emails and see their full online offerings      [place]:  [email or web address]   Third Thursday Webinars:  On the third Thursday of every month at 12 p.m. ET, join our free live webinars to learn about various aspects of living with Parkinson's disease and our work to speed medical breakthroughs.  Upcoming Webinar:  New Year, New Moves!  Explore Exercise for Life with Parkinson's.  Thursday, January 18th at 12 noon. Check out additional information on their website to see their full online offerings    [facility]:  [email or web address]  Upcoming Webinar:   Physical Therapy and Parkinson's.  Thursday, January 11th, 2 pm  Webinar Series:  Living with Parkinson's Meetup.   Third Thursdays each month, 3 pm  Care Partner Monthly Meetup.  With Robin Searing Phinney.  First Tuesday of each month, 2 pm  Check out additional information to Live Well Today  on their website    Parkinson and Movement Disorders (PMD) Alliance:  www.pmdalliance.org  [employer] Online:  Online Education Events  Sign up for emails, which are sent weekly to give you updates on programming and  online offerings    Parkinson's Association of the [place]:  www.[email or web address]  Information on online support groups, education events, and online exercises including Yoga, Parkinson's exercises and more-LOTS of information on links to PD resources and online events  Virtual Support Group through Parkinson's Association of the [place]; next one is scheduled for Wednesday, Feb 7th  MOVEMENT AND EXERCISE OPPORTUNITIES  PWR! Moves Classes at [place].  Wednesdays 10 and 11 am.   Contact Amy Marriott, PT [email or web address] if interested.  NEW PWR! Moves Class offerings at [employer].  *TUESDAYS* and Wednesdays 1-2 pm.    Contact Vonna Kotyk at  [email or web address]    Parkinson's Wellness Recovery (PWR! Moves)  www.[email or web address]  Info on the PWR! Virtual Experience:  You will have access to our expertise?through self-assessment, guided plans that start with the PD-specific fundamentals, educational content, tips, Q&A with an expert, and a growing Art therapist of PD-specific pre-recorded and live exercise classes of varying types and intensity - both physical and cognitive! If that is not enough, we offer 1:1 wellness consultations (in-person or virtual) to personalize your PWR! Research scientist (medical).   [place] Fridays:   As part of the PD Health @ Home program, this free video series focuses each week on one aspect of fitness designed to support people living with Parkinson's.? These weekly videos highlight the [place] fitness guidelines for people with Parkinson's disease.  ModemGamers.si   Dance for PD website is offering free, live-stream classes throughout the week, as well as links to [employer] of classes:  [email or web address]  Virtual dance and Pilates for Parkinson's classes: Click on the Community Tab> Parkinson's Movement Initiative Tab.  To  register for classes and for more information, visit [email or web address] and click the "community" tab.   [employer] Parkinson's Cycling Classes   [employer]:  Thursdays @ Noon-Live classes at [employer] ([employer] at [email or web address]?or [phone])  [employer]: Virtual Classes Mondays and Thursdays Jeanette Caprice classes Tuesday, Wednesday and Thursday (contact [employer] at [email or web address] ?or [phone])  [place]  Varied levels of classes are offered Tuesdays and Thursdays at [employer].   Stretching with Verdis Frederickson weekly class is also offered for people with Parkinson's  To observe a class or for more information, call [phone] or email Hezzie Bump at [email or web address]   ADDITIONAL SUPPORT AND RESOURCES  Well[employer]Spring [employer]:Online Caregiver Education Opportunities:  www.well-springsolutions.org/caregiver-education/caregiver-support-group.  You may also contact Vickki Muff at [email or web address] or [phone].     Well-Spring Navigator:  Just1Navigator program, a?free service to help individuals and families through the journey of determining care for older adults.  The "Navigator" is a [phone], Education officer, museum, who will speak with a prospective client and/or loved ones to provide an assessment of the situation and a set of recommendations for a personalized care plan -- all free of charge, and whether?Well-Spring [employer] offers the needed service or not. If the need is not a service we provide, we are well-connected with reputable programs in town that we can refer you to.  www.well-springsolutions.org or to speak with the Navigator, call [phone].

## 2022-11-20 DIAGNOSIS — Z0000 Encounter for general adult medical examination without abnormal findings: Secondary | ICD-10-CM | POA: Diagnosis not present

## 2022-11-20 DIAGNOSIS — Z125 Encounter for screening for malignant neoplasm of prostate: Secondary | ICD-10-CM | POA: Diagnosis not present

## 2022-11-20 DIAGNOSIS — E7800 Pure hypercholesterolemia, unspecified: Secondary | ICD-10-CM | POA: Diagnosis not present

## 2022-11-20 DIAGNOSIS — I10 Essential (primary) hypertension: Secondary | ICD-10-CM | POA: Diagnosis not present

## 2022-12-01 ENCOUNTER — Ambulatory Visit: Payer: Self-pay | Admitting: *Deleted

## 2022-12-01 DIAGNOSIS — M5416 Radiculopathy, lumbar region: Secondary | ICD-10-CM | POA: Diagnosis not present

## 2022-12-01 NOTE — Patient Instructions (Signed)
Visit Information  Thank you for taking time to visit with me today. Please don't hesitate to contact me if I can be of assistance to you.  Following are the goals we discussed today:  Call MD office to follow up about MRI if no call to schedule.    Please call the Suicide and Crisis Lifeline: 988 call the [place] National Suicide Prevention Lifeline: 412-299-6813 or TTY: (778)014-2039 TTY (934) 312-6365) to talk to a trained counselor call 1-800-273-TALK (toll free, 24 hour hotline) call 911 if you are experiencing a Mental Health or [place] or need someone to talk to.  Patient verbalizes understanding of instructions and care plan provided today and agrees to view in [place]. Active MyChart status and patient understanding of how to access instructions and care plan via MyChart confirmed with patient.     The patient has been provided with contact information for the care management team and has been advised to call with any health related questions or concerns.   [place] Care Management Coordinator [phone]

## 2022-12-01 NOTE — Patient Outreach (Signed)
  Care Coordination   Follow Up Visit Note   12/01/2022 Name: [name] MRN: [id number] DOB: Oct 04, 1956  [name] is a 67 y.o. year old male who sees [name], Ruben Sa, MD for primary care. I spoke with  [name] by phone today.  What matters to the patients health and wellness today?  State he has been doing well, waiting for call to schedule MRI.  Does not feel he will need follow up from [facility] but will call this RNCM if he has questions.     Goals Addressed             This Visit's Progress    COMPLETED: Health Maintenance   On track    Care Coordination Interventions: Reviewed plan for disease management. Reports adhering to plan and attending medical appointments as scheduled. Reports a pending scrotal ultrasound on 06/21/22. Reviewed medications. Reports managing well. Denies concerns r/t medication management or prescription cost. AWV up to date.  Last completed on 11/17/2021. Seen by PCP last week and ortho today.  State was having right side discomfort, will have MRI done prior to next ortho follow up.          SDOH assessments and interventions completed:  No     Care Coordination Interventions:  Yes, provided   Follow up plan: No further intervention required.   Encounter Outcome:  Pt. Visit Completed   Ruben David, RN, MSN, [place] Care Management Care Management Coordinator [phone]

## 2022-12-09 DIAGNOSIS — M545 Low back pain, unspecified: Secondary | ICD-10-CM | POA: Diagnosis not present

## 2022-12-22 DIAGNOSIS — M5416 Radiculopathy, lumbar region: Secondary | ICD-10-CM | POA: Diagnosis not present

## 2022-12-28 ENCOUNTER — Other Ambulatory Visit: Payer: Self-pay | Admitting: Neurology

## 2022-12-28 DIAGNOSIS — G20A1 Parkinson's disease without dyskinesia, without mention of fluctuations: Secondary | ICD-10-CM

## 2022-12-29 DIAGNOSIS — M5416 Radiculopathy, lumbar region: Secondary | ICD-10-CM | POA: Diagnosis not present

## 2023-01-10 DIAGNOSIS — M5416 Radiculopathy, lumbar region: Secondary | ICD-10-CM | POA: Diagnosis not present

## 2023-02-12 DIAGNOSIS — M5416 Radiculopathy, lumbar region: Secondary | ICD-10-CM | POA: Diagnosis not present

## 2023-03-22 ENCOUNTER — Other Ambulatory Visit: Payer: Self-pay | Admitting: Neurology

## 2023-03-22 DIAGNOSIS — G20A1 Parkinson's disease without dyskinesia, without mention of fluctuations: Secondary | ICD-10-CM

## 2023-04-10 DIAGNOSIS — M5416 Radiculopathy, lumbar region: Secondary | ICD-10-CM | POA: Diagnosis not present

## 2023-04-24 ENCOUNTER — Ambulatory Visit: Payer: BC Managed Care – PPO | Admitting: Neurology

## 2023-04-24 DIAGNOSIS — M5416 Radiculopathy, lumbar region: Secondary | ICD-10-CM | POA: Diagnosis not present

## 2023-04-30 ENCOUNTER — Telehealth: Payer: Self-pay

## 2023-04-30 ENCOUNTER — Other Ambulatory Visit: Payer: Self-pay | Admitting: Neurology

## 2023-04-30 NOTE — Telephone Encounter (Signed)
Hi I have a refill request for this patient. He cancelled his 04/24/23 appointment and has not rescheduled. Do you mind calling and getting him an appointment so I can refill his meds until then please     pt states that he will call back once he gets the other stuff taken care of that is going on with him. He states that he is not out of medication at this time

## 2023-05-21 DIAGNOSIS — M5416 Radiculopathy, lumbar region: Secondary | ICD-10-CM | POA: Diagnosis not present

## 2023-05-22 DIAGNOSIS — E7800 Pure hypercholesterolemia, unspecified: Secondary | ICD-10-CM | POA: Diagnosis not present

## 2023-05-22 DIAGNOSIS — J4530 Mild persistent asthma, uncomplicated: Secondary | ICD-10-CM | POA: Diagnosis not present

## 2023-05-22 DIAGNOSIS — I10 Essential (primary) hypertension: Secondary | ICD-10-CM | POA: Diagnosis not present

## 2023-05-22 DIAGNOSIS — R972 Elevated prostate specific antigen [PSA]: Secondary | ICD-10-CM | POA: Diagnosis not present

## 2023-05-22 DIAGNOSIS — J309 Allergic rhinitis, unspecified: Secondary | ICD-10-CM | POA: Diagnosis not present

## 2023-06-17 ENCOUNTER — Other Ambulatory Visit: Payer: Self-pay | Admitting: Neurology

## 2023-06-17 DIAGNOSIS — G20A1 Parkinson's disease without dyskinesia, without mention of fluctuations: Secondary | ICD-10-CM

## 2023-06-30 DIAGNOSIS — Z23 Encounter for immunization: Secondary | ICD-10-CM | POA: Diagnosis not present

## 2023-07-10 NOTE — Progress Notes (Signed)
Assessment/Plan:   1.  Parkinsons Disease  - Continue carbidopa/levodopa 25/100, 1 po tid  -continue Pramipexole 0.5 mg 3 times per day.  -discussed increasing exercise, esp now that he will be retiring after Christmas  -he has quit drinking alcohol all together   2.  LBP -has injections via [employer]  3.  F/u 6 months   Subjective:   [name] was seen today in follow up.  When I saw him last visit, I had him on levodopa monotherapy, and this demonstrated marked improvement.  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  I told him that may be the way it was, but he wanted to try pramipexole.  He noted when he first started it was really helpful but now its less helpful.  He has trouble with writing.  He has almost no tremor.  He has not had sleep attacks or compulsive behaviors.  He still is not drinking alcohol.  He is getting ready to retire.  He has not been exercising  Current movement disorder medications: Carbidopa/levodopa 25/100, 1 tablet 3 times per day (6am/11:30pm/5pm) Pramipexole 0.5 mg 3 times per day.  (Started last visit)  Prior medications: Primidone   ALLERGIES:   Allergies  Allergen Reactions   Lipitor [Atorvastatin] Other (See Comments)    Muscle aches    CURRENT MEDICATIONS:  Current Meds  Medication Sig   albuterol (PROVENTIL) (2.5 MG/3ML) 0.083% nebulizer solution Take 2.5 mg by nebulization every 6 (six) hours as needed.   amLODipine (NORVASC) 10 MG tablet Take 10 mg by mouth daily.   budesonide-formoterol (SYMBICORT) 160-4.5 MCG/ACT inhaler Inhale 2 puffs into the lungs 2 (two) times daily.   carbidopa-levodopa (SINEMET IR) 25-100 MG tablet TAKE 1 TABLET BY MOUTH THREE TIMES DAILY AT 6 AM, 10-11 AM, AND 3-4 PM   fluticasone (FLONASE) 50 MCG/ACT nasal spray Place 2 sprays into the nose daily.   fluticasone (FLOVENT HFA) 110 MCG/ACT inhaler Inhale 1 puff into the lungs 2 (two) times daily.    hydrochlorothiazide (HYDRODIURIL) 25 MG tablet Take 12.5 mg by mouth daily.    lisinopril (ZESTRIL) 40 MG tablet Take 40 mg by mouth daily.   methocarbamol (ROBAXIN) 500 MG tablet Take 500-1,000 mg by mouth every 6 (six) hours as needed.   Naphazoline-Glycerin (CLEAR EYES MAX REDNESS RELIEF OP) Place 1-2 drops into both eyes daily as needed. For red eyes   pramipexole (MIRAPEX) 0.125 MG tablet 1 po tid x 1 week, the 2 po tid   pramipexole (MIRAPEX) 0.5 MG tablet Take 1 tablet (0.5 mg total) by mouth 3 (three) times daily.   primidone (MYSOLINE) 50 MG tablet 2 po bid as directed     Objective:   PHYSICAL EXAMINATION:    VITALS:   Vitals:   07/19/23 0900  BP: 138/86  Pulse: 70  SpO2: 98%  Weight: 175 lb 3.2 oz (79.5 kg)  Height: 5\' 6"  (1.676 m)    GEN:  The patient appears stated age and is in NAD. HEENT:  Normocephalic, atraumatic.  The mucous membranes are moist. The superficial temporal arteries are without ropiness or tenderness. CV:  RRR Lungs:  CTAB Neck/HEME:  There are no carotid bruits bilaterally.  Neurological examination:  Orientation: The patient is alert and oriented x3. Cranial nerves: There is good facial symmetry with significant facial hypomimia.  Extraocular muscles are intact.  The speech is fluent and clear.  Soft palate rises symmetrically and there is no tongue deviation. Hearing  is intact to conversational tone. Sensation: Sensation is intact to light touch throughout Motor: Strength is at least antigravity x4.  Movement examination: Tone: There is nl tone in the UE/LE (marked improvement) Abnormal movements: There is no tremor today Coordination:  There is no decremation with any form of RAMS, including alternating supination and pronation of the forearm, hand opening and closing, finger taps, heel taps and toe taps bilaterally Gait and Station: The patient has no trouble arising out of the chair.  He is forward flexed and he does festinate.  He is just  mildly unstable  I have reviewed and interpreted the following labs independently    Chemistry      Component Value Date/Time   NA 139 10/01/2012 1530   K 3.3 (L) 10/01/2012 1530   CL 101 10/01/2012 1530   CO2 26 10/01/2012 1530   BUN 19 10/01/2012 1530   CREATININE 1.23 10/01/2012 1530      Component Value Date/Time   CALCIUM 9.5 10/01/2012 1530       Lab Results  Component Value Date   WBC 6.8 10/01/2012   HGB 13.8 10/01/2012   HCT 39.0 10/01/2012   MCV 89.2 10/01/2012   PLT 228 10/01/2012    No results found for: "TSH"   Total time spent on today's visit was 30 minutes, including both face-to-face time and nonface-to-face time.  Time included that spent on review of records (prior notes available to me/labs/imaging if pertinent), discussing treatment and goals, answering patient's questions and coordinating care.  Cc:  Clovis Riley, L.August Saucer, MD

## 2023-07-19 ENCOUNTER — Encounter: Payer: Self-pay | Admitting: Neurology

## 2023-07-19 ENCOUNTER — Ambulatory Visit: Payer: BC Managed Care – PPO | Admitting: Neurology

## 2023-07-19 VITALS — BP 138/86 | HR 70 | Ht 66.0 in | Wt 175.2 lb

## 2023-07-19 DIAGNOSIS — G20A1 Parkinson's disease without dyskinesia, without mention of fluctuations: Secondary | ICD-10-CM

## 2023-07-19 NOTE — Patient Instructions (Signed)
The physicians and staff at [facility] are committed to providing excellent care. You may receive a survey requesting feedback about your experience at our office. We strive to receive "very good" responses to the survey questions. If you feel that your experience would prevent you from giving the office a "very good " response, please contact our office to try to remedy the situation. We may be reached at [phone]. Thank you for taking the time out of your busy day to complete the survey.  

## 2023-07-24 DIAGNOSIS — I10 Essential (primary) hypertension: Secondary | ICD-10-CM | POA: Diagnosis not present

## 2023-08-13 DIAGNOSIS — R311 Benign essential microscopic hematuria: Secondary | ICD-10-CM | POA: Diagnosis not present

## 2023-08-13 DIAGNOSIS — R972 Elevated prostate specific antigen [PSA]: Secondary | ICD-10-CM | POA: Diagnosis not present

## 2023-08-22 ENCOUNTER — Ambulatory Visit: Payer: BC Managed Care – PPO | Admitting: Podiatry

## 2023-08-22 ENCOUNTER — Encounter: Payer: Self-pay | Admitting: Podiatry

## 2023-08-22 DIAGNOSIS — M7751 Other enthesopathy of right foot: Secondary | ICD-10-CM

## 2023-08-22 MED ORDER — TRIAMCINOLONE ACETONIDE 10 MG/ML IJ SUSP
10.0000 mg | Freq: Once | INTRAMUSCULAR | Status: AC
  Administered 2023-08-22: 10 mg via INTRA_ARTICULAR

## 2023-08-23 NOTE — Progress Notes (Signed)
Subjective:   Patient ID: [name], male   DOB: 67 y.o.   MRN: [id number]   HPI Patient presents with a lot of pain around his big toe joint right and states it has been recent and he has to wear steel toe shoes now with low-grade inflammation of both feet and plantar   ROS      Objective:  Physical Exam  Neurovascular status intact inflammation fluid around the first MPJ right foot moderately painful when pressed with keratotic tissue formation bilateral     Assessment:  Inflammatory capsulitis of the first MPJ right foot with pain along with keratotic tissue formation     Plan:  H&P reviewed sterile prep and injected around the first MPJ right 3 mg dexamethasone Kenalog 5 mg Xylocaine to reduce the inflammation periarticular and debrided tissue left patient to be seen back as symptoms indicate may require other treatment plans

## 2023-09-04 DIAGNOSIS — M47816 Spondylosis without myelopathy or radiculopathy, lumbar region: Secondary | ICD-10-CM | POA: Diagnosis not present

## 2023-11-06 DIAGNOSIS — R972 Elevated prostate specific antigen [PSA]: Secondary | ICD-10-CM | POA: Diagnosis not present

## 2023-11-13 DIAGNOSIS — R311 Benign essential microscopic hematuria: Secondary | ICD-10-CM | POA: Diagnosis not present

## 2023-11-13 DIAGNOSIS — R972 Elevated prostate specific antigen [PSA]: Secondary | ICD-10-CM | POA: Diagnosis not present

## 2023-11-14 ENCOUNTER — Ambulatory Visit: Payer: BC Managed Care – PPO | Admitting: Podiatry

## 2023-11-14 ENCOUNTER — Ambulatory Visit (INDEPENDENT_AMBULATORY_CARE_PROVIDER_SITE_OTHER): Payer: BC Managed Care – PPO

## 2023-11-14 DIAGNOSIS — M2141 Flat foot [pes planus] (acquired), right foot: Secondary | ICD-10-CM | POA: Diagnosis not present

## 2023-11-14 DIAGNOSIS — M2142 Flat foot [pes planus] (acquired), left foot: Secondary | ICD-10-CM | POA: Diagnosis not present

## 2023-11-14 DIAGNOSIS — M7752 Other enthesopathy of left foot: Secondary | ICD-10-CM

## 2023-11-14 DIAGNOSIS — M7751 Other enthesopathy of right foot: Secondary | ICD-10-CM

## 2023-11-14 MED ORDER — TRIAMCINOLONE ACETONIDE 10 MG/ML IJ SUSP
10.0000 mg | Freq: Once | INTRAMUSCULAR | Status: AC
  Administered 2023-11-14: 10 mg via INTRA_ARTICULAR

## 2023-11-14 NOTE — Progress Notes (Signed)
Subjective:   Patient ID: [name], male   DOB: 68 y.o.   MRN: [id number]   HPI Patient presents with a lot of pain in the outside of the left foot and also is having pain in the right second joint.  Both of them can be bothersome   ROS      Objective:  Physical Exam  Neurovascular status unchanged but does have significant structural deformity of both feet with bunion deformity has inflammation of the fifth metatarsal left painful when pressed and second MPJ right fluid buildup around the joint surface      Assessment:  Inflammatory capsulitis of the left fifth MPJ right second MPJ with history of structural deformity and bunion     Plan:  H&P reviewed all conditions and the possibility at 1 point surgical intervention will be indicated.  Today I went ahead sterile prep injected the fifth MPJ left 3 mg Dexasone Kenalog 5 mg Xylocaine and the right second MPJ 3 mg Dexasone Kenalog 5 mg Xylocaine periarticular.  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

## 2023-11-22 DIAGNOSIS — Z0000 Encounter for general adult medical examination without abnormal findings: Secondary | ICD-10-CM | POA: Diagnosis not present

## 2023-11-22 DIAGNOSIS — J4530 Mild persistent asthma, uncomplicated: Secondary | ICD-10-CM | POA: Diagnosis not present

## 2023-11-22 DIAGNOSIS — E7800 Pure hypercholesterolemia, unspecified: Secondary | ICD-10-CM | POA: Diagnosis not present

## 2023-11-22 DIAGNOSIS — I10 Essential (primary) hypertension: Secondary | ICD-10-CM | POA: Diagnosis not present

## 2023-11-22 DIAGNOSIS — G20A1 Parkinson's disease without dyskinesia, without mention of fluctuations: Secondary | ICD-10-CM | POA: Diagnosis not present

## 2023-11-23 ENCOUNTER — Other Ambulatory Visit (HOSPITAL_COMMUNITY): Payer: Self-pay | Admitting: Family Medicine

## 2023-11-23 DIAGNOSIS — R011 Cardiac murmur, unspecified: Secondary | ICD-10-CM

## 2023-11-29 ENCOUNTER — Ambulatory Visit (HOSPITAL_COMMUNITY)
Admission: RE | Admit: 2023-11-29 | Discharge: 2023-11-29 | Discharge status: Home / Self Care | Payer: BC Managed Care – PPO | Source: Ambulatory Visit | Attending: Family Medicine | Admitting: Family Medicine

## 2023-11-29 DIAGNOSIS — I10 Essential (primary) hypertension: Secondary | ICD-10-CM | POA: Diagnosis not present

## 2023-11-29 DIAGNOSIS — I080 Rheumatic disorders of both mitral and aortic valves: Secondary | ICD-10-CM | POA: Diagnosis not present

## 2023-11-29 DIAGNOSIS — R011 Cardiac murmur, unspecified: Secondary | ICD-10-CM

## 2023-11-29 LAB — ECHOCARDIOGRAM COMPLETE
Area-P 1/2: 3.99 cm2
Calc EF: 65.7 %
P 1/2 time: 1044 ms
S' Lateral: 2.2 cm
Single Plane A2C EF: 52 %
Single Plane A4C EF: 75.6 %

## 2023-12-14 DIAGNOSIS — M545 Low back pain, unspecified: Secondary | ICD-10-CM | POA: Diagnosis not present

## 2023-12-31 DIAGNOSIS — M47816 Spondylosis without myelopathy or radiculopathy, lumbar region: Secondary | ICD-10-CM | POA: Diagnosis not present

## 2024-01-05 DIAGNOSIS — H524 Presbyopia: Secondary | ICD-10-CM | POA: Diagnosis not present

## 2024-01-05 DIAGNOSIS — H2513 Age-related nuclear cataract, bilateral: Secondary | ICD-10-CM | POA: Diagnosis not present

## 2024-01-17 ENCOUNTER — Ambulatory Visit: Payer: BC Managed Care – PPO | Admitting: Neurology

## 2024-01-29 DIAGNOSIS — R809 Proteinuria, unspecified: Secondary | ICD-10-CM | POA: Diagnosis not present

## 2024-03-05 DIAGNOSIS — R809 Proteinuria, unspecified: Secondary | ICD-10-CM | POA: Diagnosis not present

## 2024-03-25 DIAGNOSIS — M47816 Spondylosis without myelopathy or radiculopathy, lumbar region: Secondary | ICD-10-CM | POA: Diagnosis not present

## 2024-04-08 DIAGNOSIS — M47816 Spondylosis without myelopathy or radiculopathy, lumbar region: Secondary | ICD-10-CM | POA: Diagnosis not present

## 2024-04-25 DIAGNOSIS — M47816 Spondylosis without myelopathy or radiculopathy, lumbar region: Secondary | ICD-10-CM | POA: Diagnosis not present

## 2024-05-05 DIAGNOSIS — M47816 Spondylosis without myelopathy or radiculopathy, lumbar region: Secondary | ICD-10-CM | POA: Diagnosis not present

## 2024-05-05 DIAGNOSIS — M791 Myalgia, unspecified site: Secondary | ICD-10-CM | POA: Diagnosis not present

## 2024-05-19 DIAGNOSIS — E7800 Pure hypercholesterolemia, unspecified: Secondary | ICD-10-CM | POA: Diagnosis not present

## 2024-05-19 DIAGNOSIS — I10 Essential (primary) hypertension: Secondary | ICD-10-CM | POA: Diagnosis not present

## 2024-05-19 DIAGNOSIS — R7301 Impaired fasting glucose: Secondary | ICD-10-CM | POA: Diagnosis not present

## 2024-05-21 DIAGNOSIS — R809 Proteinuria, unspecified: Secondary | ICD-10-CM | POA: Diagnosis not present

## 2024-05-21 DIAGNOSIS — E7800 Pure hypercholesterolemia, unspecified: Secondary | ICD-10-CM | POA: Diagnosis not present

## 2024-05-21 DIAGNOSIS — I77810 Thoracic aortic ectasia: Secondary | ICD-10-CM | POA: Diagnosis not present

## 2024-05-21 DIAGNOSIS — M545 Low back pain, unspecified: Secondary | ICD-10-CM | POA: Diagnosis not present

## 2024-05-21 DIAGNOSIS — J4530 Mild persistent asthma, uncomplicated: Secondary | ICD-10-CM | POA: Diagnosis not present

## 2024-05-21 DIAGNOSIS — I10 Essential (primary) hypertension: Secondary | ICD-10-CM | POA: Diagnosis not present

## 2024-05-21 DIAGNOSIS — G20A1 Parkinson's disease without dyskinesia, without mention of fluctuations: Secondary | ICD-10-CM | POA: Diagnosis not present

## 2024-06-12 ENCOUNTER — Ambulatory Visit: Admitting: Physical Medicine and Rehabilitation

## 2024-06-28 DIAGNOSIS — Z23 Encounter for immunization: Secondary | ICD-10-CM | POA: Diagnosis not present

## 2024-07-08 DIAGNOSIS — Z79891 Long term (current) use of opiate analgesic: Secondary | ICD-10-CM | POA: Diagnosis not present

## 2024-07-08 DIAGNOSIS — M5136 Other intervertebral disc degeneration, lumbar region with discogenic back pain only: Secondary | ICD-10-CM | POA: Diagnosis not present

## 2024-07-08 DIAGNOSIS — M6281 Muscle weakness (generalized): Secondary | ICD-10-CM | POA: Diagnosis not present

## 2024-07-10 DIAGNOSIS — M545 Low back pain, unspecified: Secondary | ICD-10-CM | POA: Diagnosis not present

## 2024-07-10 DIAGNOSIS — M6281 Muscle weakness (generalized): Secondary | ICD-10-CM | POA: Diagnosis not present

## 2024-07-10 DIAGNOSIS — M5136 Other intervertebral disc degeneration, lumbar region with discogenic back pain only: Secondary | ICD-10-CM | POA: Diagnosis not present

## 2024-07-22 DIAGNOSIS — M545 Low back pain, unspecified: Secondary | ICD-10-CM | POA: Diagnosis not present

## 2024-07-22 DIAGNOSIS — M5136 Other intervertebral disc degeneration, lumbar region with discogenic back pain only: Secondary | ICD-10-CM | POA: Diagnosis not present

## 2024-07-22 DIAGNOSIS — M6281 Muscle weakness (generalized): Secondary | ICD-10-CM | POA: Diagnosis not present

## 2024-07-24 DIAGNOSIS — M5136 Other intervertebral disc degeneration, lumbar region with discogenic back pain only: Secondary | ICD-10-CM | POA: Diagnosis not present

## 2024-07-24 DIAGNOSIS — M545 Low back pain, unspecified: Secondary | ICD-10-CM | POA: Diagnosis not present

## 2024-07-24 DIAGNOSIS — M6281 Muscle weakness (generalized): Secondary | ICD-10-CM | POA: Diagnosis not present

## 2024-07-28 DIAGNOSIS — M6281 Muscle weakness (generalized): Secondary | ICD-10-CM | POA: Diagnosis not present

## 2024-07-28 DIAGNOSIS — M545 Low back pain, unspecified: Secondary | ICD-10-CM | POA: Diagnosis not present

## 2024-07-28 DIAGNOSIS — M5136 Other intervertebral disc degeneration, lumbar region with discogenic back pain only: Secondary | ICD-10-CM | POA: Diagnosis not present

## 2024-07-30 DIAGNOSIS — M5136 Other intervertebral disc degeneration, lumbar region with discogenic back pain only: Secondary | ICD-10-CM | POA: Diagnosis not present

## 2024-07-30 DIAGNOSIS — M6281 Muscle weakness (generalized): Secondary | ICD-10-CM | POA: Diagnosis not present

## 2024-07-30 DIAGNOSIS — M545 Low back pain, unspecified: Secondary | ICD-10-CM | POA: Diagnosis not present

## 2024-08-01 DIAGNOSIS — M545 Low back pain, unspecified: Secondary | ICD-10-CM | POA: Diagnosis not present

## 2024-08-01 DIAGNOSIS — M5136 Other intervertebral disc degeneration, lumbar region with discogenic back pain only: Secondary | ICD-10-CM | POA: Diagnosis not present

## 2024-08-01 DIAGNOSIS — M6281 Muscle weakness (generalized): Secondary | ICD-10-CM | POA: Diagnosis not present

## 2024-08-04 DIAGNOSIS — M545 Low back pain, unspecified: Secondary | ICD-10-CM | POA: Diagnosis not present

## 2024-08-04 DIAGNOSIS — M5136 Other intervertebral disc degeneration, lumbar region with discogenic back pain only: Secondary | ICD-10-CM | POA: Diagnosis not present

## 2024-08-04 DIAGNOSIS — M6281 Muscle weakness (generalized): Secondary | ICD-10-CM | POA: Diagnosis not present

## 2024-08-05 DIAGNOSIS — Z79891 Long term (current) use of opiate analgesic: Secondary | ICD-10-CM | POA: Diagnosis not present

## 2024-08-05 DIAGNOSIS — M533 Sacrococcygeal disorders, not elsewhere classified: Secondary | ICD-10-CM | POA: Diagnosis not present

## 2024-08-05 DIAGNOSIS — G8929 Other chronic pain: Secondary | ICD-10-CM | POA: Diagnosis not present

## 2024-08-06 DIAGNOSIS — M545 Low back pain, unspecified: Secondary | ICD-10-CM | POA: Diagnosis not present

## 2024-08-06 DIAGNOSIS — M5136 Other intervertebral disc degeneration, lumbar region with discogenic back pain only: Secondary | ICD-10-CM | POA: Diagnosis not present

## 2024-08-06 DIAGNOSIS — M6281 Muscle weakness (generalized): Secondary | ICD-10-CM | POA: Diagnosis not present

## 2024-08-08 DIAGNOSIS — M545 Low back pain, unspecified: Secondary | ICD-10-CM | POA: Diagnosis not present

## 2024-08-08 DIAGNOSIS — M6281 Muscle weakness (generalized): Secondary | ICD-10-CM | POA: Diagnosis not present

## 2024-08-08 DIAGNOSIS — M5136 Other intervertebral disc degeneration, lumbar region with discogenic back pain only: Secondary | ICD-10-CM | POA: Diagnosis not present

## 2024-08-11 DIAGNOSIS — M545 Low back pain, unspecified: Secondary | ICD-10-CM | POA: Diagnosis not present

## 2024-08-11 DIAGNOSIS — M6281 Muscle weakness (generalized): Secondary | ICD-10-CM | POA: Diagnosis not present

## 2024-08-11 DIAGNOSIS — M5136 Other intervertebral disc degeneration, lumbar region with discogenic back pain only: Secondary | ICD-10-CM | POA: Diagnosis not present

## 2024-08-13 DIAGNOSIS — M545 Low back pain, unspecified: Secondary | ICD-10-CM | POA: Diagnosis not present

## 2024-08-13 DIAGNOSIS — M6281 Muscle weakness (generalized): Secondary | ICD-10-CM | POA: Diagnosis not present

## 2024-08-13 DIAGNOSIS — M5136 Other intervertebral disc degeneration, lumbar region with discogenic back pain only: Secondary | ICD-10-CM | POA: Diagnosis not present

## 2024-08-18 ENCOUNTER — Encounter: Payer: Self-pay | Admitting: Radiology

## 2024-08-26 NOTE — Progress Notes (Unsigned)
 Assessment/Plan:   1.  Parkinsons Disease  - Patient looks significantly worse than last visit (he looks like he did prior to starting medication for Parkinson's).  This is likely because he is not taking medication as directed.  - Discussed importance of compliance with medication and follow-up visits.  He is reporting he takes medication faithfully but pramipexole /primidone  hasn't been filled since 2023 and 2024.  Even after talking about this, he still reports that he has been taking it faithfully.  - restart carbidopa /levodopa  25/100, 1 po tid.  He is currently only taking it once per day.  -may need to restart pramipexole  but we will hold on restarting that for now.  - I asked him to bring me his bottles next visit  - Referred to physical and speech therapy  - Discussed Parkinson's disease in detail and the things that it can affect.  Friend was with him today and stated that he did not even know he had Parkinson's disease, even though he delivers his medication to him.  -he has quit drinking alcohol all together and both patient/friend agree that he has not been drinking alcohol.  Proud of him for that.  - Talked to the patient extensively about his sleep/wake schedule.  I told him he needed to be up and out of the bed by 10 AM.   2.  LBP - Following with [facility]  3.  F/u 6 months   Subjective:   [name] was seen today in follow up.  Pt with friend, [name], who supplements hx.  Patient has not been seen in a year because of canceled appointments.  His primary care physician has been refilling his levodopa .  He is no longer on the pramipexole .  Dispense reports indicate that this was last filled April, 2024 for a 54-month supply (although patient told me he was on it when I saw him last October).  Notes from primary care indicate patient had stopped his levodopa  for several months, but then his condition worsened and wanted to go back on medication and  his primary care physician told him to follow-up here.  Pt states that he is only taking 1 levodopa  per day.  He reports he is still on the pramipexole , although last filled 01/2023.  He also states that he is on primidone .  We stopped that previously and it was last filled 07/2022.    Current movement disorder medications: Carbidopa /levodopa  25/100, 1 tablet 3 times per day (6am/11:30pm/5pm) Pramipexole  0.5 mg 3 times per day.    Prior medications: Primidone ; pramipexole    ALLERGIES:   Allergies  Allergen Reactions   Lipitor [Atorvastatin] Other (See Comments)    Muscle aches    CURRENT MEDICATIONS:  Current Meds  Medication Sig   albuterol (PROVENTIL) (2.5 MG/3ML) 0.083% nebulizer solution Take 2.5 mg by nebulization every 6 (six) hours as needed.   amLODipine (NORVASC) 10 MG tablet Take 10 mg by mouth daily.   budesonide-formoterol (SYMBICORT) 160-4.5 MCG/ACT inhaler Inhale 2 puffs into the lungs 2 (two) times daily.   carbidopa -levodopa  (SINEMET  IR) 25-100 MG tablet TAKE 1 TABLET BY MOUTH THREE TIMES DAILY AT 6 AM, 10-11 AM, AND 3-4 PM   fluticasone (FLONASE) 50 MCG/ACT nasal spray Place 2 sprays into the nose daily.   fluticasone (FLOVENT HFA) 110 MCG/ACT inhaler Inhale 1 puff into the lungs 2 (two) times daily.   hydrochlorothiazide (HYDRODIURIL) 25 MG tablet Take 12.5 mg by mouth daily.    lisinopril (ZESTRIL)  40 MG tablet Take 40 mg by mouth daily.   methocarbamol (ROBAXIN) 500 MG tablet Take 500-1,000 mg by mouth every 6 (six) hours as needed.   Naphazoline-Glycerin (CLEAR EYES MAX REDNESS RELIEF OP) Place 1-2 drops into both eyes daily as needed. For red eyes   pramipexole  (MIRAPEX ) 0.125 MG tablet 1 po tid x 1 week, the 2 po tid   pramipexole  (MIRAPEX ) 0.5 MG tablet Take 1 tablet (0.5 mg total) by mouth 3 (three) times daily.   primidone  (MYSOLINE ) 50 MG tablet 2 po bid as directed     Objective:   PHYSICAL EXAMINATION:    VITALS:   Vitals:   08/28/24 1434  BP:  136/84  Pulse: 64  SpO2: 98%  Weight: 153 lb (69.4 kg)     GEN:  The patient appears stated age and is in NAD. HEENT:  Normocephalic, atraumatic.  The mucous membranes are moist. The superficial temporal arteries are without ropiness or tenderness. CV:  RRR Lungs:  CTAB Neck/HEME:  There are no carotid bruits bilaterally.  Neurological examination:  Orientation: The patient is alert and oriented x3. Cranial nerves: There is good facial symmetry with significant facial hypomimia.  Extraocular muscles are intact.  The speech is fluent and clear.  Soft palate rises symmetrically and there is no tongue deviation. Hearing is intact to conversational tone. Sensation: Sensation is intact to light touch throughout Motor: Strength is at least antigravity x4.  Movement examination: Tone: There is mod increased tone in the LUE/LLE Abnormal movements: There is no tremor today Coordination:  There is moderate decremation with any form of RAMS, including alternating supination and pronation of the forearm, hand opening and closing, finger taps, heel taps and toe taps on the left Gait and Station: The patient pushes off to arise.  He ambulates on his toes and is significantly flexed at the waist.  He ambulates with a walker.  He is unsteady and festinates.  I have reviewed and interpreted the following labs independently    Chemistry      Component Value Date/Time   NA 139 10/01/2012 1530   K 3.3 (L) 10/01/2012 1530   CL 101 10/01/2012 1530   CO2 26 10/01/2012 1530   BUN 19 10/01/2012 1530   CREATININE 1.23 10/01/2012 1530      Component Value Date/Time   CALCIUM 9.5 10/01/2012 1530       Lab Results  Component Value Date   WBC 6.8 10/01/2012   HGB 13.8 10/01/2012   HCT 39.0 10/01/2012   MCV 89.2 10/01/2012   PLT 228 10/01/2012    No results found for: TSH   Total time spent on today's visit was 45 minutes, including both face-to-face time and nonface-to-face time.  Time  included that spent on review of records (prior notes available to me/labs/imaging if pertinent), discussing treatment and goals, answering patient's questions and coordinating care.  Cc:  Leonel Cole, MD

## 2024-08-28 ENCOUNTER — Encounter: Payer: Self-pay | Admitting: Neurology

## 2024-08-28 ENCOUNTER — Ambulatory Visit: Admitting: Neurology

## 2024-08-28 VITALS — BP 136/84 | HR 64 | Wt 153.0 lb

## 2024-08-28 DIAGNOSIS — G20A1 Parkinson's disease without dyskinesia, without mention of fluctuations: Secondary | ICD-10-CM | POA: Diagnosis not present

## 2024-08-28 MED ORDER — CARBIDOPA-LEVODOPA 25-100 MG PO TABS: 1.0000 | ORAL_TABLET | Freq: Three times a day (TID) | ORAL | 1 refills | Status: AC

## 2024-08-28 NOTE — Patient Instructions (Addendum)
 Restart carbidopa /levodopa  25/100 at 10am/2pm/6pm Bring me your bottles of pramipexole  and primidone   The physicians and staff at [facility] are committed to providing excellent care. You may receive a survey requesting feedback about your experience at our office. We strive to receive very good responses to the survey questions. If you feel that your experience would prevent you from giving the office a very good  response, please contact our office to try to remedy the situation. We may be reached at [phone]. Thank you for taking the time out of your busy day to complete the survey.

## 2024-09-01 ENCOUNTER — Telehealth: Payer: Self-pay | Admitting: Neurology

## 2024-09-01 ENCOUNTER — Other Ambulatory Visit: Payer: Self-pay

## 2024-09-01 NOTE — Telephone Encounter (Signed)
 Pt needs help with dosage of Rx carbidopa -levodopa  (SINEMET  IR) 25-100 MG tablet and is sue for next dosage today around 10am

## 2024-09-01 NOTE — Telephone Encounter (Signed)
 Called patient and let him now no pramipexole 

## 2024-09-23 ENCOUNTER — Other Ambulatory Visit: Payer: Self-pay

## 2024-09-23 ENCOUNTER — Ambulatory Visit

## 2024-09-23 DIAGNOSIS — M6281 Muscle weakness (generalized): Secondary | ICD-10-CM | POA: Diagnosis present

## 2024-09-23 DIAGNOSIS — G20A1 Parkinson's disease without dyskinesia, without mention of fluctuations: Secondary | ICD-10-CM | POA: Diagnosis not present

## 2024-09-23 DIAGNOSIS — R471 Dysarthria and anarthria: Secondary | ICD-10-CM | POA: Diagnosis present

## 2024-09-23 DIAGNOSIS — R293 Abnormal posture: Secondary | ICD-10-CM | POA: Insufficient documentation

## 2024-09-23 DIAGNOSIS — R2681 Unsteadiness on feet: Secondary | ICD-10-CM | POA: Insufficient documentation

## 2024-09-23 DIAGNOSIS — R2689 Other abnormalities of gait and mobility: Secondary | ICD-10-CM | POA: Diagnosis present

## 2024-09-23 DIAGNOSIS — R262 Difficulty in walking, not elsewhere classified: Secondary | ICD-10-CM | POA: Diagnosis present

## 2024-09-23 NOTE — Therapy (Signed)
 OUTPATIENT SPEECH LANGUAGE PATHOLOGY PARKINSON'S EVALUATION   Patient Name: [name] MRN: [id number] DOB:May 21, 1956, 68 y.o., male Today's Date: 09/23/2024  PCP: Leonel Cole, MD REFERRING PROVIDER: Evonnie Stabs, DO  END OF SESSION:  End of Session - 09/23/24 1740     Visit Number 1    Number of Visits 9    Date for Recertification  10/31/24    SLP Start Time 1405    SLP Stop Time  1445    SLP Time Calculation (min) 40 min    Activity Tolerance Patient tolerated treatment well          Past Medical History:  Diagnosis Date   Allergy    Asthma    Erectile dysfunction    Hypertension    Hypopotassemia    Impotence of organic origin    Inguinal hernia without mention of obstruction or gangrene, unilateral or unspecified, (not specified as recurrent)    Pure hypercholesterolemia    Spermatocele    Urinary reflux    as child     Past Surgical History:  Procedure Laterality Date   ESOPHAGEAL DILATION  03   HERNIA REPAIR     INGUINAL HERNIA REPAIR  10/10/2012   Procedure: LAPAROSCOPIC INGUINAL HERNIA;  Surgeon: Redell Faith, DO;  Location: [facility] OR;  Service: General;  Laterality: Right;   INSERTION OF MESH  10/10/2012   Procedure: INSERTION OF MESH;  Surgeon: Redell Faith, DO;  Location: [facility] OR;  Service: General;  Laterality: Right;   UPPER GI ENDOSCOPY  03   for food caught in thoat   There are no active problems to display for this patient.   ONSET DATE: script 08/28/24  REFERRING DIAG: Parkinson's disease  THERAPY DIAG:  Dysarthria and anarthria  Rationale for Evaluation and Treatment: Rehabilitation  SUBJECTIVE:   SUBJECTIVE STATEMENT: He says I mumble but nobody else tells me I do.   Pt accompanied by: self  PERTINENT HISTORY: see above  PAIN:  Are you having pain? Yes: NPRS scale: 9/10 Pain location: low back Pain description: pinch Aggravating factors: sit to stand Relieving factors: rest  FALLS: Has patient fallen in last 6 months?   Yes, See PT evaluation for details  LIVING ENVIRONMENT: Lives with: lives with an adult companion Lives in: House/apartment  PLOF:  Level of assistance: Independent with ADLs, Independent with IADLs Employment: Retired  PATIENT GOALS: Improve communication  OBJECTIVE:  Note: Objective measures were completed at Evaluation unless otherwise noted.  DIAGNOSTIC FINDINGS:  Assessment/Plan:  TAT- 08/28/24  1.  Parkinsons Disease             - Patient looks significantly worse than last visit (he looks like he did prior to starting medication for Parkinson's).  This is likely because he is not taking medication as directed.             - Discussed importance of compliance with medication and follow-up visits.  He is reporting he takes medication faithfully but pramipexole /primidone  hasn't been filled since 2023 and 2024.  Even after talking about this, he still reports that he has been taking it faithfully.             - restart carbidopa /levodopa  25/100, 1 po tid.  He is currently only taking it once per day.             -may need to restart pramipexole  but we will hold on restarting that for now.             -  I asked him to bring me his bottles next visit             - Referred to physical and speech therapy             - Discussed Parkinson's disease in detail and the things that it can affect.  Friend was with him today and stated that he did not even know he had Parkinson's disease, even though he delivers his medication to him.             -he has quit drinking alcohol all together and both patient/friend agree that he has not been drinking alcohol.  Proud of him for that.             - Talked to the patient extensively about his sleep/wake schedule.  I told him he needed to be up and out of the bed by 10 AM.  2.  LBP - Following with [facility]   3.  F/u 6 months   Subjective:    [name] was seen today in follow up.  Pt with friend, [name], who  supplements hx.  Patient has not been seen in a year because of canceled appointments.  His primary care physician has been refilling his levodopa .  He is no longer on the pramipexole .  Dispense reports indicate that this was last filled April, 2024 for a 47-month supply (although patient told me he was on it when I saw him last October).  Notes from primary care indicate patient had stopped his levodopa  for several months, but then his condition worsened and wanted to go back on medication and his primary care physician told him to follow-up here.  Pt states that he is only taking 1 levodopa  per day.  He reports he is still on the pramipexole , although last filled 01/2023.  He also states that he is on primidone .  We stopped that previously and it was last filled 07/2022.     Current movement disorder medications: Carbidopa /levodopa  25/100, 1 tablet 3 times per day (6am/11:30pm/5pm) Pramipexole  0.5 mg 3 times per day.     Prior medications: Primidone ; pramipexole   MR BRAIN WO CONT IMPRESSION: 1. No acute intracranial abnormality. 2. 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  Electronically Signed   By: Katyucia  de Macedo Rodrigues M.D.   On: 08/07/2022 17:00  COGNITION: Overall cognitive status: Within functional limits for tasks assessed  MOTOR SPEECH: Overall motor speech: impaired Level of impairment: Conversation Respiration: thoracic breathing Phonation: normal Resonance: WFL Articulation: Appears intact Intelligibility: Intelligible Effective technique: Abdominal breathing  ORAL MOTOR EXAMINATION: Overall status: Impaired: Labial: Bilateral (Coordination) Lingual: Bilateral (Coordination) Comments: Pt strength was WNL   OBJECTIVE VOICE ASSESSMENT: Sustained ah maximum phonation time: 12.3 seconds Sustained ah loudness average: 80 dB Oral reading (passage) loudness average: 71 dB Oral reading loudness range: 54-82 dB Conversational  loudness average: 71 dB Conversational loudness range: 52-81 dB Voice quality: hoarse (slight) Stimulability trials: Given SLP modeling and rare min cues, loudness average increased to 73dB (range of 63-86dB) at conversation level. After 17 minutes conversation decr'd to 69dB average.  Comments: Pt used thoracic breathing throughout.   Completed audio recording of patients baseline voice without cueing from SLP: No  Pt does not report difficulty with swallowing which does not warrant further evaluation.  PATIENT REPORTED OUTCOME MEASURES (PROM): Communication Effectiveness Survey: to be provided in first 1-2 sessions  TREATMENT DATE:   Abdominal breathing=AB  09/23/24: n/a   PATIENT EDUCATION: Education details: likely goals in ST Person educated: Patient Education method: Explanation Education comprehension: verbalized understanding  HOME EXERCISE PROGRAM: Eventually, AB and phonatory effort tasks   GOALS: Goals reviewed with patient? Yes  SHORT TERM GOALS: =  LONG TERM GOALS: Target date: 10/31/24  Pt will demo HEP with mod I in 2 sessions Baseline:  Goal status: INITIAL  2.  Pt will improve PROM Baseline:  Goal status: INITIAL  3.  Pt will tell SLP 3 common overt s/sx swallowing difficulties in PD with mod I Baseline:  Goal status: INITIAL  4.  Pt will maintain WNL volume and AB 70% in 15 minutes conversation in 2 sessions  Baseline:  Goal status: INITIAL  5.  Pt and/or roommate will tell SLP how to maximize conversational effectiveness Baseline:  Goal status: INITIAL    ASSESSMENT:  CLINICAL IMPRESSION: Patient is a 68 y.o. M who was seen today for assessment of communication in light of PD. Pt's volume today was WNL (70dB) but after 17 minutes of min-mod complex conversation became more variable in nature with average 69dB. He  would benefit from a short course of ST to educate him about maximizing conversational effectiveness as he states his roommate tells him that he is difficult to understand, initiating some exercises for maintaining strong speaking voice, as well as learning how to engage abdominal musculature when speaking (pt was breathing with thoracic musculature). Today pt was 100% intelligible but used abdominal breathing only approx 15% of the time.   OBJECTIVE IMPAIRMENTS: Objective impairments include dysarthria. These impairments are limiting patient from ADLs/IADLs and effectively communicating at home and in community.Factors affecting potential to achieve goals and functional outcome are family/community support.. Patient will benefit from skilled SLP services to address above impairments and improve overall function.  REHAB POTENTIAL: Good  PLAN:  SLP FREQUENCY: 2x/week  SLP DURATION: 4 weeks  PLANNED INTERVENTIONS: Functional tasks, SLP instruction and feedback, Compensatory strategies, Patient/family education, [phone] Treatment of speech (30 or 45 min) , and speech exercises    Yaphet Smethurst, CCC-SLP 09/23/2024, 5:40 PM  .[email or web address]

## 2024-09-23 NOTE — Therapy (Unsigned)
 OUTPATIENT PHYSICAL THERAPY NEURO EVALUATION   Patient Name: [name] MRN: [id number] DOB:1955-12-07, 68 y.o., male Today's Date: 09/23/2024   PCP: Leonel Cole, MD REFERRING PROVIDER: Evonnie Asberry RAMAN, DO  END OF SESSION:  PT End of Session - 09/23/24 1438     Visit Number 1    Authorization Type [employer]    Progress Note Due on Visit 10    PT Start Time 1445    PT Stop Time 1530    PT Time Calculation (min) 45 min          Past Medical History:  Diagnosis Date   Allergy    Asthma    Erectile dysfunction    Hypertension    Hypopotassemia    Impotence of organic origin    Inguinal hernia without mention of obstruction or gangrene, unilateral or unspecified, (not specified as recurrent)    Pure hypercholesterolemia    Spermatocele    Urinary reflux    as child     Past Surgical History:  Procedure Laterality Date   ESOPHAGEAL DILATION  03   HERNIA REPAIR     INGUINAL HERNIA REPAIR  10/10/2012   Procedure: LAPAROSCOPIC INGUINAL HERNIA;  Surgeon: Redell Faith, DO;  Location: [facility] OR;  Service: General;  Laterality: Right;   INSERTION OF MESH  10/10/2012   Procedure: INSERTION OF MESH;  Surgeon: Redell Faith, DO;  Location: [facility] OR;  Service: General;  Laterality: Right;   UPPER GI ENDOSCOPY  03   for food caught in thoat   There are no active problems to display for this patient.   ONSET DATE:   REFERRING DIAG:  G20.A1 (ICD-10-CM) - Parkinson's disease without dyskinesia or fluctuating manifestations (HCC)    THERAPY DIAG:  No diagnosis found.  Rationale for Evaluation and Treatment: Rehabilitation  SUBJECTIVE:                                                                                                                                                                                             SUBJECTIVE STATEMENT: Pt with PD and has been experiencing increased difficulty with transfers and walking with freezing of gait. Pt reports  increased difficulty with walking requiring walker all the time and has experienced 4 falls in past. Recently finished PT for LBP where he was performing land and aquatic exercise.  Presently, reports he is primarily at home.  Pt accompanied by: self  PERTINENT HISTORY:  L THR and lumbar surgery about 5 years ago PAIN:  Are you having pain? Yes: NPRS scale: 9/10 Pain location: low back Pain description: pinch Aggravating factors: sit to stand  Relieving factors: rest  PRECAUTIONS: Fall  RED FLAGS: None   WEIGHT BEARING RESTRICTIONS: No  FALLS: Has patient fallen in last 6 months? Yes. Number of falls 4  LIVING ENVIRONMENT: Lives with: lives with an adult companion Lives in: House/apartment Stairs: Yes: Internal: 14 steps; can reach both and External: 3 steps; on right going up Has following equipment at home: Walker - 2 wheeled, quad cane,   PLOF: Independent with basic ADLs  PATIENT GOALS: improve balance/mobility/walking  OBJECTIVE:  Note: Objective measures were completed at Evaluation unless otherwise noted.  DIAGNOSTIC FINDINGS:   COGNITION: Overall cognitive status: Within functional limits for tasks assessed   SENSATION: Not tested  COORDINATION: Deficits to rapid alternating movement L > R Heel to shin: incr difficulty w/ LLE    MUSCLE TONE: increased tone noted LUE/LLE    POSTURE: rounded shoulders, forward head, and flexed trunk   LOWER EXTREMITY ROM:     Active  Right Eval Left Eval  Hip flexion 110 100  Hip extension    Hip abduction    Hip adduction    Hip internal rotation    Hip external rotation    Knee flexion 120 120  Knee extension -5 -5  Ankle dorsiflexion 12 10  Ankle plantarflexion    Ankle inversion    Ankle eversion     (Blank rows = not tested)  LOWER EXTREMITY MMT:    BLE 4/5 to seated resisted tests  BED MOBILITY:  Not tested--reports independence  TRANSFERS: Sit to stand: instances retropulsion--pushes back of  knees to chair/table   CURB:  Findings: SBA w/ RW  STAIRS: BHR w/ modified indep GAIT: Findings: Gait Characteristics: festinating and Comments: freezing in turns--tendency to leave foot on ground and pivot-slide around  FUNCTIONAL TESTS:  5 times sit to stand: 28 sec 10 meter walk test: 17 sec = 1.9 ft/sec Mini-BESTest: 16/28 TUG test: regular 13 sec; cognitive 17 sec (stops counting)   [facility] PT Assessment - 09/23/24 0001       Standardized Balance Assessment   Standardized Balance Assessment Mini-BESTest      Mini-BESTest   Sit To Stand Normal: Comes to stand without use of hands and stabilizes independently.    Rise to Toes Normal: Stable for 3 s with maximum height.    Stand on one leg (left) Moderate: < 20 s    Stand on one leg (right) Moderate: < 20 s    Stand on one leg - lowest score 1    Compensatory Stepping Correction - Forward Moderate: More than one step is required to recover equilibrium    Compensatory Stepping Correction - Backward No step, OR would fall if not caught, OR falls spontaneously.    Compensatory Stepping Correction - Left Lateral Severe: Falls, or cannot step    Compensatory Stepping Correction - Right Lateral Moderate: Several steps to recover equilibrium    Stepping Corredtion Lateral - lowest score 0    Stance - Feet together, eyes open, firm surface  Normal: 30s    Stance - Feet together, eyes closed, foam surface  Normal: 30s    Incline - Eyes Closed Normal: Stands independently 30s and aligns with gravity    Change in Gait Speed Normal: Significantly changes walkling speed without imbalance    Walk with head turns - Horizontal Moderate: performs head turns with reduction in gait speed.    Walk with pivot turns Severe: Cannot turn with feet close at any speed without imbalance.    Step  over obstacles Moderate: Steps over box but touches box OR displays cautious behavior by slowing gait.    Timed UP & GO with Dual Task Severe: Stops counting  while walking OR stops walking while counting.   reg: 13 sec. cog: 17 sec   Mini-BEST total score 16          PATIENT SURVEYS:  Freezing of gait questionnaire TBD                                                                                                                              TREATMENT DATE: ***    PATIENT EDUCATION: Education details: *** Person educated: {Person educated:25204} Education method: {Education Method:25205} Education comprehension: {Education Comprehension:25206}  HOME EXERCISE PROGRAM: ***  GOALS: Goals reviewed with patient? {yes/no:20286}  SHORT TERM GOALS: Target date: ***  *** Baseline: Goal status: INITIAL  2.  *** Baseline:  Goal status: INITIAL  3.  *** Baseline:  Goal status: INITIAL  4.  *** Baseline:  Goal status: INITIAL  5.  *** Baseline:  Goal status: INITIAL  6.  *** Baseline:  Goal status: INITIAL  LONG TERM GOALS: Target date: ***  *** Baseline:  Goal status: INITIAL  2.  *** Baseline:  Goal status: INITIAL  3.  *** Baseline:  Goal status: INITIAL  4.  *** Baseline:  Goal status: INITIAL  5.  *** Baseline:  Goal status: INITIAL  6.  *** Baseline:  Goal status: INITIAL  ASSESSMENT:  CLINICAL IMPRESSION: Patient is a *** y.o. *** who was seen today for physical therapy evaluation and treatment for ***.   OBJECTIVE IMPAIRMENTS: {opptimpairments:25111}.   ACTIVITY LIMITATIONS: {activitylimitations:27494}  PARTICIPATION LIMITATIONS: {participationrestrictions:25113}  PERSONAL FACTORS: {Personal factors:25162} are also affecting patient's functional outcome.   REHAB POTENTIAL: {rehabpotential:25112}  CLINICAL DECISION MAKING: {clinical decision making:25114}  EVALUATION COMPLEXITY: {Evaluation complexity:25115}  PLAN:  PT FREQUENCY: {rehab frequency:25116}  PT DURATION: {rehab duration:25117}  PLANNED INTERVENTIONS: {rehab planned interventions:25118::97110-Therapeutic  exercises,97530- Therapeutic [phone]- Neuromuscular re-education,97535- Self Rjmz,02859- Manual therapy,Patient/Family education}  PLAN FOR NEXT SESSION: ***   Jonette MARLA Sandifer, PT 09/23/2024, 2:39 PM

## 2024-09-29 ENCOUNTER — Encounter: Payer: Self-pay | Admitting: Physical Therapy

## 2024-09-29 ENCOUNTER — Ambulatory Visit: Admitting: Physical Therapy

## 2024-09-29 DIAGNOSIS — R2681 Unsteadiness on feet: Secondary | ICD-10-CM | POA: Diagnosis not present

## 2024-09-29 DIAGNOSIS — M6281 Muscle weakness (generalized): Secondary | ICD-10-CM

## 2024-09-29 DIAGNOSIS — R2689 Other abnormalities of gait and mobility: Secondary | ICD-10-CM

## 2024-09-29 NOTE — Therapy (Signed)
 OUTPATIENT PHYSICAL THERAPY NEURO TREATMENT NOTE   Patient Name: [name] MRN: [id number] DOB:July 20, 1956, 68 y.o., male Today's Date: 09/29/2024   PCP: Leonel Cole, MD REFERRING PROVIDER: Evonnie Asberry RAMAN, DO  END OF SESSION:  PT End of Session - 09/29/24 1010     Visit Number 2    Number of Visits 12    Date for Recertification  11/04/24    Authorization Type [employer]    Authorization Time Period **Auth#: [id number], approved 7 PT visits from 09/23/2024 - 12/16/2024.**    Authorization - Visit Number 2    Authorization - Number of Visits 7    Progress Note Due on Visit 10    PT Start Time 1015    PT Stop Time 1056    PT Time Calculation (min) 41 min    Equipment Utilized During Treatment Gait belt    Activity Tolerance Patient tolerated treatment well    Behavior During Therapy WFL for tasks assessed/performed           Past Medical History:  Diagnosis Date   Allergy    Asthma    Erectile dysfunction    Hypertension    Hypopotassemia    Impotence of organic origin    Inguinal hernia without mention of obstruction or gangrene, unilateral or unspecified, (not specified as recurrent)    Pure hypercholesterolemia    Spermatocele    Urinary reflux    as child     Past Surgical History:  Procedure Laterality Date   ESOPHAGEAL DILATION  03   HERNIA REPAIR     INGUINAL HERNIA REPAIR  10/10/2012   Procedure: LAPAROSCOPIC INGUINAL HERNIA;  Surgeon: Redell Faith, DO;  Location: [facility] OR;  Service: General;  Laterality: Right;   INSERTION OF MESH  10/10/2012   Procedure: INSERTION OF MESH;  Surgeon: Redell Faith, DO;  Location: [facility] OR;  Service: General;  Laterality: Right;   UPPER GI ENDOSCOPY  03   for food caught in thoat   There are no active problems to display for this patient.   ONSET DATE:   REFERRING DIAG:  G20.A1 (ICD-10-CM) - Parkinson's disease without dyskinesia or fluctuating manifestations (HCC)    THERAPY DIAG:  Unsteadiness  on feet  Other abnormalities of gait and mobility  Muscle weakness (generalized)  Rationale for Evaluation and Treatment: Rehabilitation  SUBJECTIVE:                                                                                                                                                                                             SUBJECTIVE STATEMENT: Doing okay today.    Pt  accompanied by: self  PERTINENT HISTORY:  L THR and lumbar surgery about 5 years ago PAIN:  Are you having pain? Yes: NPRS scale: 5/10 Pain location: low back Pain description: pinch Aggravating factors: sit to stand Relieving factors: rest  PRECAUTIONS: Fall  RED FLAGS: None   WEIGHT BEARING RESTRICTIONS: No  FALLS: Has patient fallen in last 6 months? Yes. Number of falls 4  LIVING ENVIRONMENT: Lives with: lives with an adult companion Lives in: House/apartment Stairs: Yes: Internal: 14 steps; can reach both and External: 3 steps; on right going up Has following equipment at home: Walker - 2 wheeled, quad cane,   PLOF: Independent with basic ADLs  PATIENT GOALS: improve balance/mobility/walking  OBJECTIVE:    TODAY'S TREATMENT: 09/29/2024 Activity Comments  NuStep, Level 3, 4 extremities, x 8 minutes For aerobic warm up, self-selected pace 70's, intervals of >100 SPM, then increased resistance to Level 5, SPM >80  Sit to stand from 18 chair, 2 x 5 reps Cues for technique  Sit to stand from 22 mat (simulating bed height) 2 x 5 Cues for technique  Seated PWR! Moves: PWR! Up x 10 PWR! Rock x 10 PWR! Twist x 10 PWR! Step x 10 Verbal and visual cues         Access Code: [email or web address] URL: [email or web address] Date: 09/29/2024 Prepared by: [facility] - Outpatient  Rehab - [facility]  Exercises - Sit to Stand  - 1 x daily - 7 x weekly - 3 sets - 5 reps SEATED PWR! MOVES, 10 reps, once/day  PATIENT EDUCATION: Education details: HEP initiated-see below + seated  PWR! Moves (handout + PWR! Moves You tube); answered pt's questions encouraging seated leg exercises at home Person educated: Patient Education method: Explanation, Demonstration, Tactile cues, Verbal cues, and Handouts Education comprehension: verbalized understanding, returned demonstration, verbal cues required, and needs further education  --------------------------------------- Note: Objective measures were completed at Evaluation unless otherwise noted.  DIAGNOSTIC FINDINGS:   COGNITION: Overall cognitive status: Within functional limits for tasks assessed   SENSATION: Not tested  COORDINATION: Deficits to rapid alternating movement L > R Heel to shin: incr difficulty w/ LLE    MUSCLE TONE: increased tone noted LUE/LLE    POSTURE: rounded shoulders, forward head, and flexed trunk   LOWER EXTREMITY ROM:     Active  Right Eval Left Eval  Hip flexion 110 100  Hip extension    Hip abduction    Hip adduction    Hip internal rotation    Hip external rotation    Knee flexion 120 120  Knee extension -5 -5  Ankle dorsiflexion 12 10  Ankle plantarflexion    Ankle inversion    Ankle eversion     (Blank rows = not tested)  LOWER EXTREMITY MMT:    BLE 4/5 to seated resisted tests  BED MOBILITY:  Not tested--reports independence  TRANSFERS: Sit to stand: instances retropulsion--pushes back of knees to chair/table   CURB:  Findings: SBA w/ RW  STAIRS: BHR w/ modified indep GAIT: Findings: Gait Characteristics: festinating and Comments: freezing in turns--tendency to leave foot on ground and pivot-slide around  FUNCTIONAL TESTS:  5 times sit to stand: 28 sec 10 meter walk test: 17 sec = 1.9 ft/sec Mini-BESTest: 16/28 TUG test: regular 13 sec; cognitive 17 sec (stops counting) w/ RW     PATIENT SURVEYS:  Freezing of gait questionnaire TBD  TREATMENT DATE: 09/24/24    PATIENT EDUCATION: Education details: assessment details, rationale of PT intervention in regards to PD Person educated: Patient Education method: Explanation Education comprehension: verbalized understanding  HOME EXERCISE PROGRAM: TBD  GOALS: Goals reviewed with patient? Yes  SHORT TERM GOALS: Target date: 10/15/2024    Patient will be independent in HEP to improve functional outcomes Baseline: Goal status: IN PROGRESS  2.  Demo improved BLE strength and control with low risk for falls per time 15 sec 5xSTS test Baseline: 28 sec w/ compensation Goal status: IN PROGRESS   LONG TERM GOALS: Target date: 11/05/2024    Teach back relevant PD programs in community for exercise participation Baseline:  Goal status: IN PROGRESS  2.  Teach back strategies to address freezing of gait Baseline:  Goal status: IN PROGRESS  3.  Improve balance and reduce risk for falls per score 22/28 Mini-BESTest Baseline: 16/28 Goal status: IN PROGRESS  4.  Improve gait speed to 2.8 ft/sec to improve efficiency of community ambulation Baseline: 1.9 ft/sec w/ RW Goal status: IN PROGRESS    ASSESSMENT:  CLINICAL IMPRESSION: Pt presents today with no new complaints, no falls. Skilled PT session focused on aerobic warm up + seated PWR! Moves + sit to stand practice from regular chair and bed height.  Worked on technique for sit to stand, and pt likes the seated PWR! Moves for posture, flexibility, rocking, and stepping motions; initiated HEP to reflect these exercises.  He does well with cues for intensity of movement patterns, but likely will need cues and repetition for optimal carryover.  Pt will continue to benefit from skilled PT towards goals for improved functional mobility and decreased fall risk.   EVAL:  Patient is a 68 y.o. male who was seen today for physical therapy evaluation and treatment for G20.A1 (ICD-10-CM) - Parkinson's disease without  dyskinesia or fluctuating manifestations (HCC).  Exhibits symptoms of bradykinesia, postural dysfunction, high risk for falls per outcome measures, gait deviations and deficits with freezing of gait, chronic LBP, decreased gait speed, and increased need for assistance for functional mobility and ADL.  Pt would benefit from PT services to address deficits and limitations and provide relevant education for community resources.    OBJECTIVE IMPAIRMENTS: Abnormal gait, decreased activity tolerance, decreased balance, decreased coordination, decreased endurance, decreased knowledge of condition, decreased mobility, difficulty walking, decreased ROM, decreased strength, impaired tone, postural dysfunction, and pain.   ACTIVITY LIMITATIONS: carrying, lifting, bending, standing, stairs, transfers, and locomotion level  PARTICIPATION LIMITATIONS: meal prep, cleaning, laundry, interpersonal relationship, driving, shopping, community activity, and exercise routine  PERSONAL FACTORS: Age, Time since onset of injury/illness/exacerbation, and 1-2 comorbidities: PMH including hx of lumbar spine sx, PD, HTN are also affecting patient's functional outcome.   REHAB POTENTIAL: Good  CLINICAL DECISION MAKING: Evolving/moderate complexity  EVALUATION COMPLEXITY: Moderate  PLAN:  PT FREQUENCY: 1-2x/week  PT DURATION: 6 weeks  PLANNED INTERVENTIONS: 97750- Physical Performance Testing, 97110-Therapeutic exercises, 97530- Therapeutic activity, [id number]- Neuromuscular re-education, 97535- Self Care, 02859- Manual therapy, [phone]- Gait training, [phone]- Canalith repositioning, [id number]- Aquatic Therapy, H9716- Electrical stimulation (unattended), and Patient/Family education  PLAN FOR NEXT SESSION: Review and progress HEP for sit to stand, postural re-ed for upright/unsupported stand, lower extremity strenghtening; seated>standing PWR! [address]   Greig Anon, [place] 09/29/2024 10:58 AM Phone: [phone] Fax:  [phone]  [facility] at [facility] [address] [place], [place] [address] Phone # [phone] Fax # [phone]

## 2024-10-02 ENCOUNTER — Ambulatory Visit

## 2024-10-02 DIAGNOSIS — R2681 Unsteadiness on feet: Secondary | ICD-10-CM | POA: Diagnosis not present

## 2024-10-02 DIAGNOSIS — R471 Dysarthria and anarthria: Secondary | ICD-10-CM

## 2024-10-02 NOTE — Patient Instructions (Signed)
 Pt HEP: Perform all these exercises at least 5x/day with intent!  MAY-ME-MY-MOE-MOO (like a cow) - With intent! Hold an ah for 10 seconds or shorter. Glide up, glide down (Take a breath in between) Count to 12 using intent. (Take a breath after every 3 numbers)

## 2024-10-02 NOTE — Therapy (Signed)
 OUTPATIENT SPEECH LANGUAGE PATHOLOGY PARKINSON'S TREATMENT   Patient Name: [name] MRN: [id number] DOB:[birth year], 68 y.o., male Today's Date: 10/02/2024  PCP: Leonel Cole, MD REFERRING PROVIDER: Evonnie Stabs, DO  END OF SESSION:  End of Session - 10/02/24 1612     Visit Number 2    Number of Visits 9    Date for Recertification  10/31/24    SLP Start Time 1530    SLP Stop Time  1610    SLP Time Calculation (min) 40 min    Activity Tolerance Patient tolerated treatment well           Past Medical History:  Diagnosis Date   Allergy    Asthma    Erectile dysfunction    Hypertension    Hypopotassemia    Impotence of organic origin    Inguinal hernia without mention of obstruction or gangrene, unilateral or unspecified, (not specified as recurrent)    Pure hypercholesterolemia    Spermatocele    Urinary reflux    as child     Past Surgical History:  Procedure Laterality Date   ESOPHAGEAL DILATION  03   HERNIA REPAIR     INGUINAL HERNIA REPAIR  10/10/2012   Procedure: LAPAROSCOPIC INGUINAL HERNIA;  Surgeon: Redell Faith, DO;  Location: [facility] OR;  Service: General;  Laterality: Right;   INSERTION OF MESH  10/10/2012   Procedure: INSERTION OF MESH;  Surgeon: Redell Faith, DO;  Location: [facility] OR;  Service: General;  Laterality: Right;   UPPER GI ENDOSCOPY  03   for food caught in thoat   There are no active problems to display for this patient.   ONSET DATE: script 08/28/24  REFERRING DIAG: Parkinson's disease  THERAPY DIAG:  Dysarthria and anarthria  Rationale for Evaluation and Treatment: Rehabilitation  SUBJECTIVE:   SUBJECTIVE STATEMENT: He says I mumble but nobody else tells me I do.   Pt accompanied by: self  PERTINENT HISTORY: see above  PAIN:  Are you having pain? Yes: NPRS scale: 9/10 Pain location: low back Pain description: pinch Aggravating factors: sit to stand Relieving factors: rest  FALLS: Has patient fallen in last 6  months?  Yes, See PT evaluation for details  LIVING ENVIRONMENT: Lives with: lives with an adult companion Lives in: House/apartment  PLOF:  Level of assistance: Independent with ADLs, Independent with IADLs Employment: Retired  PATIENT GOALS: Improve communication  OBJECTIVE:  Note: Objective measures were completed at Evaluation unless otherwise noted.  DIAGNOSTIC FINDINGS:  Assessment/Plan:  TAT- 08/28/24  1.  Parkinsons Disease             - Patient looks significantly worse than last visit (he looks like he did prior to starting medication for Parkinson's).  This is likely because he is not taking medication as directed.             - Discussed importance of compliance with medication and follow-up visits.  He is reporting he takes medication faithfully but pramipexole /primidone  hasn't been filled since 2023 and 2024.  Even after talking about this, he still reports that he has been taking it faithfully.             - restart carbidopa /levodopa  25/100, 1 po tid.  He is currently only taking it once per day.             -may need to restart pramipexole  but we will hold on restarting that for now.             -  I asked him to bring me his bottles next visit             - Referred to physical and speech therapy             - Discussed Parkinson's disease in detail and the things that it can affect.  Friend was with him today and stated that he did not even know he had Parkinson's disease, even though he delivers his medication to him.             -he has quit drinking alcohol all together and both patient/friend agree that he has not been drinking alcohol.  Proud of him for that.             - Talked to the patient extensively about his sleep/wake schedule.  I told him he needed to be up and out of the bed by 10 AM.  2.  LBP - Following with [facility]   3.  F/u 6 months   Subjective:    [name] was seen today in follow up.  Pt with friend, [name],  who supplements hx.  Patient has not been seen in a year because of canceled appointments.  His primary care physician has been refilling his levodopa .  He is no longer on the pramipexole .  Dispense reports indicate that this was last filled April, 2024 for a 15-month supply (although patient told me he was on it when I saw him last October).  Notes from primary care indicate patient had stopped his levodopa  for several months, but then his condition worsened and wanted to go back on medication and his primary care physician told him to follow-up here.  Pt states that he is only taking 1 levodopa  per day.  He reports he is still on the pramipexole , although last filled 01/2023.  He also states that he is on primidone .  We stopped that previously and it was last filled 07/2022.     Current movement disorder medications: Carbidopa /levodopa  25/100, 1 tablet 3 times per day (6am/11:30pm/5pm) Pramipexole  0.5 mg 3 times per day.     Prior medications: Primidone ; pramipexole   MR BRAIN WO CONT IMPRESSION: 1. No acute intracranial abnormality. 2. 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  Electronically Signed   By: Katyucia  de Macedo Rodrigues M.D.   On: 08/07/2022 17:00  COGNITION: Overall cognitive status: Within functional limits for tasks assessed  MOTOR SPEECH: Overall motor speech: impaired Level of impairment: Conversation Respiration: thoracic breathing Phonation: normal Resonance: WFL Articulation: Appears intact Intelligibility: Intelligible Effective technique: Abdominal breathing  ORAL MOTOR EXAMINATION: Overall status: Impaired: Labial: Bilateral (Coordination) Lingual: Bilateral (Coordination) Comments: Pt strength was WNL   OBJECTIVE VOICE ASSESSMENT: Sustained ah maximum phonation time: 12.3 seconds Sustained ah loudness average: 80 dB Oral reading (passage) loudness average: 71 dB Oral reading loudness range: 54-82  dB Conversational loudness average: 71 dB Conversational loudness range: 52-81 dB Voice quality: hoarse (slight) Stimulability trials: Given SLP modeling and rare min cues, loudness average increased to 73dB (range of 63-86dB) at conversation level. After 17 minutes conversation decr'd to 69dB average.  Comments: Pt used thoracic breathing throughout.   Completed audio recording of patients baseline voice without cueing from SLP: No  Pt does not report difficulty with swallowing which does not warrant further evaluation.  PATIENT REPORTED OUTCOME MEASURES (PROM): Communication Effectiveness Survey: to be provided in first 1-2 sessions  TREATMENT DATE:   Abdominal breathing=AB 10/02/24: SLP initiated SPEAK OUT! Program.  Targeted volume and intelligibility using Speak Out! Lesson 1 and 2. Pt required frequent min verbal cues, modeling, for volume and breath support.   Pt averages the following volume levels:  Sustained AH: 82 dB  Counting:85 dB  Reading (phrases): 82 dB  Cognitive Exercise: 74 dB Required frequent min verbal cues, modeling for carryover of intent /volume answering simple questions following structured practice. SLP provided pt homework related to SPEAKOUT! Program. Plan is to get pt formal HEP for SO! to promote generalization of speaking with intent. In addition, plan is to continue SO! Program for optimizing speech production for QOL.  09/23/24: n/a   PATIENT EDUCATION: Education details: likely goals in ST Person educated: Patient Education method: Explanation Education comprehension: verbalized understanding  HOME EXERCISE PROGRAM: Eventually, AB and phonatory effort tasks   GOALS: Goals reviewed with patient? Yes  SHORT TERM GOALS: =  LONG TERM GOALS: Target date: 10/31/24  Pt will demo HEP with mod I in 2 sessions Baseline:   Goal status: ONGOING  2.  Pt will improve PROM Baseline:  Goal status: ONGOING  3.  Pt will tell SLP 3 common overt s/sx swallowing difficulties in PD with mod I Baseline:  Goal status: ONGOING  4.  Pt will maintain WNL volume and AB 70% in 15 minutes conversation in 2 sessions  Baseline:  Goal status: ONGOING  5.  Pt and/or roommate will tell SLP how to maximize conversational effectiveness Baseline:  Goal status: ONGOING    ASSESSMENT:  CLINICAL IMPRESSION: Patient is a 68 y.o. M who was seen today for assessment of communication in light of PD. Pt's volume today was WNL (70dB) but after 17 minutes of min-mod complex conversation became more variable in nature with average 69dB. He would benefit from a short course of ST to educate him about maximizing conversational effectiveness as he states his roommate tells him that he is difficult to understand, initiating some exercises for maintaining strong speaking voice, as well as learning how to engage abdominal musculature when speaking (pt was breathing with thoracic musculature). Today pt was 100% intelligible but used abdominal breathing only approx 15% of the time.   OBJECTIVE IMPAIRMENTS: Objective impairments include dysarthria. These impairments are limiting patient from ADLs/IADLs and effectively communicating at home and in community.Factors affecting potential to achieve goals and functional outcome are family/community support.. Patient will benefit from skilled SLP services to address above impairments and improve overall function.  REHAB POTENTIAL: Good  PLAN:  SLP FREQUENCY: 2x/week  SLP DURATION: 4 weeks  PLANNED INTERVENTIONS: Functional tasks, SLP instruction and feedback, Compensatory strategies, Patient/family education, [phone] Treatment of speech (30 or 45 min) , and speech exercises    Waddell Music, CF-SLP 10/02/2024, 4:12 PM  .[email or web address]

## 2024-10-08 ENCOUNTER — Ambulatory Visit

## 2024-10-08 DIAGNOSIS — R2681 Unsteadiness on feet: Secondary | ICD-10-CM | POA: Diagnosis not present

## 2024-10-08 DIAGNOSIS — R471 Dysarthria and anarthria: Secondary | ICD-10-CM

## 2024-10-08 NOTE — Therapy (Signed)
 " OUTPATIENT SPEECH LANGUAGE PATHOLOGY PARKINSON'S TREATMENT   Patient Name: [name] MRN: [id number] DOB:Mar 08, 1956, 68 y.o., male Today's Date: 10/08/2024  PCP: Leonel Cole, MD REFERRING PROVIDER: Evonnie Stabs, DO  END OF SESSION:  End of Session - 10/08/24 1023     Visit Number 3    Number of Visits 9    Date for Recertification  10/31/24    SLP Start Time 1021    SLP Stop Time  1100    SLP Time Calculation (min) 39 min    Activity Tolerance Patient tolerated treatment well           Past Medical History:  Diagnosis Date   Allergy    Asthma    Erectile dysfunction    Hypertension    Hypopotassemia    Impotence of organic origin    Inguinal hernia without mention of obstruction or gangrene, unilateral or unspecified, (not specified as recurrent)    Pure hypercholesterolemia    Spermatocele    Urinary reflux    as child     Past Surgical History:  Procedure Laterality Date   ESOPHAGEAL DILATION  03   HERNIA REPAIR     INGUINAL HERNIA REPAIR  10/10/2012   Procedure: LAPAROSCOPIC INGUINAL HERNIA;  Surgeon: Redell Faith, DO;  Location: [facility] OR;  Service: General;  Laterality: Right;   INSERTION OF MESH  10/10/2012   Procedure: INSERTION OF MESH;  Surgeon: Redell Faith, DO;  Location: [facility] OR;  Service: General;  Laterality: Right;   UPPER GI ENDOSCOPY  03   for food caught in thoat   There are no active problems to display for this patient.   ONSET DATE: script 08/28/24  REFERRING DIAG: Parkinson's disease  THERAPY DIAG:  Dysarthria and anarthria  Rationale for Evaluation and Treatment: Rehabilitation  SUBJECTIVE:   SUBJECTIVE STATEMENT: I'm not talking to the floor. (Pt, with SLP comment to lift his head when vocalizing)   Pt accompanied by: self  PERTINENT HISTORY: see above  PAIN:  Are you having pain? Yes: NPRS scale: 8/10 Pain location: low back Pain description: pinch Aggravating factors: sit to stand Relieving factors:  rest  FALLS: Has patient fallen in last 6 months?  Yes, See PT evaluation for details   PATIENT GOALS: Improve communication  OBJECTIVE:  Note: Objective measures were completed at Evaluation unless otherwise noted.  DIAGNOSTIC FINDINGS:  Assessment/Plan:  TAT- 08/28/24  1.  Parkinsons Disease             - Patient looks significantly worse than last visit (he looks like he did prior to starting medication for Parkinson's).  This is likely because he is not taking medication as directed.             - Discussed importance of compliance with medication and follow-up visits.  He is reporting he takes medication faithfully but pramipexole /primidone  hasn't been filled since 2023 and 2024.  Even after talking about this, he still reports that he has been taking it faithfully.             - restart carbidopa /levodopa  25/100, 1 po tid.  He is currently only taking it once per day.             -may need to restart pramipexole  but we will hold on restarting that for now.             - I asked him to bring me his bottles next visit             -  Referred to physical and speech therapy             - Discussed Parkinson's disease in detail and the things that it can affect.  Friend was with him today and stated that he did not even know he had Parkinson's disease, even though he delivers his medication to him.             -he has quit drinking alcohol all together and both patient/friend agree that he has not been drinking alcohol.  Proud of him for that.             - Talked to the patient extensively about his sleep/wake schedule.  I told him he needed to be up and out of the bed by 10 AM.  2.  LBP - Following with [facility]   3.  F/u 6 months   Subjective:    [name] was seen today in follow up.  Pt with friend, [name], who supplements hx.  Patient has not been seen in a year because of canceled appointments.  His primary care physician has been refilling his  levodopa .  He is no longer on the pramipexole .  Dispense reports indicate that this was last filled April, 2024 for a 55-month supply (although patient told me he was on it when I saw him last October).  Notes from primary care indicate patient had stopped his levodopa  for several months, but then his condition worsened and wanted to go back on medication and his primary care physician told him to follow-up here.  Pt states that he is only taking 1 levodopa  per day.  He reports he is still on the pramipexole , although last filled 01/2023.  He also states that he is on primidone .  We stopped that previously and it was last filled 07/2022.     Current movement disorder medications: Carbidopa /levodopa  25/100, 1 tablet 3 times per day (6am/11:30pm/5pm) Pramipexole  0.5 mg 3 times per day.     Prior medications: Primidone ; pramipexole   MR BRAIN WO CONT 08/07/22 IMPRESSION: 1. No acute intracranial abnormality. 2. 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    PATIENT REPORTED OUTCOME MEASURES (PROM): Communication Effectiveness Survey: to be provided in first 1-2 sessions                                                                                                                            TREATMENT DATE:   Abdominal breathing=AB, SO=SpeakOut!  10/08/24: Pt needs PROM next session. SLP used SO to improve pt's level of intent when talking, and speech clarity. Pt required consistent faded to occasional modeling, and consistent faded to occasional min verbal cues for volume and breath support. Pt utilized SO approx 70% of productions today. SLP provided pt homework related to SO. Today SLP introduced pt to s/sx swallowing difficulty. He told SLP some of these with mod I.   10/02/24: SLP initiated SPEAK OUT! Program.  Targeted volume and intelligibility using Speak Out! Lesson 1 and 2. Pt required frequent min verbal cues, modeling, for volume and breath support.    Pt averages the following volume levels:  Sustained AH: 82 dB  Counting:85 dB  Reading (phrases): 82 dB  Cognitive Exercise: 74 dB Required frequent min verbal cues, modeling for carryover of intent /volume answering simple questions following structured practice. SLP provided pt homework related to SPEAKOUT! Program. Plan is to get pt formal HEP for SO! to promote generalization of speaking with intent. In addition, plan is to continue SO! Program for optimizing speech production for QOL.  09/23/24: n/a   PATIENT EDUCATION: Education details: see treatment date Person educated: Patient Education method: Explanation Education comprehension: verbalized understanding  HOME EXERCISE PROGRAM: Eventually, AB and phonatory effort tasks   GOALS: Goals reviewed with patient? Yes  SHORT TERM GOALS: =  LONG TERM GOALS: Target date: 10/31/24  Pt will demo HEP with mod I in 2 sessions Baseline:  Goal status: ONGOING  2.  Pt will improve PROM Baseline:  Goal status: ONGOING  3.  Pt will tell SLP 3 common overt s/sx swallowing difficulties in PD with mod I Baseline:  Goal status: met  4.  Pt will maintain WNL volume and AB 70% in 15 minutes conversation in 2 sessions  Baseline:  Goal status: ONGOING  5.  Pt and/or roommate will tell SLP how to maximize conversational effectiveness Baseline:  Goal status: ONGOING    ASSESSMENT:  CLINICAL IMPRESSION: Patient is a 68 y.o. M who was seen today for treatment of communication in light of PD. See treatment date above for today's date for further details on today's session.  Pt's volume on day of eval was WNL (70dB) but after 17 minutes of min-mod complex conversation became more variable in nature with average 69dB. He would benefit from a short course of ST to educate him about maximizing conversational effectiveness as he states his roommate tells him that he is difficult to understand, initiating some exercises for  maintaining strong speaking voice, as well as learning how to engage abdominal musculature when speaking (pt was breathing with thoracic musculature). Today pt was 100% intelligible but used abdominal breathing only approx 15% of the time.   OBJECTIVE IMPAIRMENTS: Objective impairments include dysarthria. These impairments are limiting patient from ADLs/IADLs and effectively communicating at home and in community.Factors affecting potential to achieve goals and functional outcome are family/community support.. Patient will benefit from skilled SLP services to address above impairments and improve overall function.  REHAB POTENTIAL: Good  PLAN:  SLP FREQUENCY: 2x/week  SLP DURATION: 4 weeks  PLANNED INTERVENTIONS: Functional tasks, SLP instruction and feedback, Compensatory strategies, Patient/family education, [phone] Treatment of speech (30 or 45 min) , and speech exercises    Waddell Music, CF-SLP 10/08/2024, 10:23 AM  .[email or web address]    "

## 2024-10-08 NOTE — Patient Instructions (Signed)
" ° ° ° ° ° °  Signs of trouble swallowing:  You have difficulty passing food through the throat, or are choking or coughing  when you eat or drink  You are clearing your throat often when you are eating or drinking  You have a wet or gurgly voice when you are eating or drinking  If you have one or more of these signs contact your Parkinson's doctor or your primary care doctor  "

## 2024-10-10 ENCOUNTER — Ambulatory Visit: Admitting: Physical Therapy

## 2024-10-10 ENCOUNTER — Encounter: Payer: Self-pay | Admitting: Physical Therapy

## 2024-10-10 DIAGNOSIS — R2681 Unsteadiness on feet: Secondary | ICD-10-CM

## 2024-10-10 DIAGNOSIS — R2689 Other abnormalities of gait and mobility: Secondary | ICD-10-CM

## 2024-10-10 DIAGNOSIS — M6281 Muscle weakness (generalized): Secondary | ICD-10-CM

## 2024-10-10 NOTE — Therapy (Signed)
 " OUTPATIENT PHYSICAL THERAPY NEURO TREATMENT NOTE   Patient Name: [name] MRN: [id number] DOB:06/17/1956, 68 y.o., male Today's Date: 10/10/2024   PCP: Leonel Cole, MD REFERRING PROVIDER: Evonnie Asberry RAMAN, DO  END OF SESSION:  PT End of Session - 10/10/24 1102     Visit Number 3    Number of Visits 12    Date for Recertification  11/04/24    Authorization Type [employer]    Authorization Time Period **Auth#: [id number], approved 7 PT visits from 09/23/2024 - 12/16/2024.**    Authorization - Visit Number 3    Authorization - Number of Visits 7    Progress Note Due on Visit 10    PT Start Time 1104    PT Stop Time 1142    PT Time Calculation (min) 38 min    Equipment Utilized During Treatment Gait belt    Activity Tolerance Patient tolerated treatment well    Behavior During Therapy WFL for tasks assessed/performed            Past Medical History:  Diagnosis Date   Allergy    Asthma    Erectile dysfunction    Hypertension    Hypopotassemia    Impotence of organic origin    Inguinal hernia without mention of obstruction or gangrene, unilateral or unspecified, (not specified as recurrent)    Pure hypercholesterolemia    Spermatocele    Urinary reflux    as child     Past Surgical History:  Procedure Laterality Date   ESOPHAGEAL DILATION  03   HERNIA REPAIR     INGUINAL HERNIA REPAIR  10/10/2012   Procedure: LAPAROSCOPIC INGUINAL HERNIA;  Surgeon: Redell Faith, DO;  Location: [facility] OR;  Service: General;  Laterality: Right;   INSERTION OF MESH  10/10/2012   Procedure: INSERTION OF MESH;  Surgeon: Redell Faith, DO;  Location: [facility];  Service: General;  Laterality: Right;   UPPER GI ENDOSCOPY  03   for food caught in thoat   There are no active problems to display for this patient.   ONSET DATE:   REFERRING DIAG:  G20.A1 (ICD-10-CM) - Parkinson's disease without dyskinesia or fluctuating manifestations (HCC)    THERAPY DIAG:   Unsteadiness on feet  Other abnormalities of gait and mobility  Muscle weakness (generalized)  Rationale for Evaluation and Treatment: Rehabilitation  SUBJECTIVE:                                                                                                                                                                                             SUBJECTIVE STATEMENT: Doing okay.  Doing some  of my exercises.  Having a little pain in back and to see the back doctor on 12/30.   Pt accompanied by: self  PERTINENT HISTORY:  L THR and lumbar surgery about 5 years ago PAIN:  Are you having pain? Yes: NPRS scale: 6/10 Pain location: low back Pain description: pinch Aggravating factors: sit to stand Relieving factors: rest  PRECAUTIONS: Fall  RED FLAGS: None   WEIGHT BEARING RESTRICTIONS: No  FALLS: Has patient fallen in last 6 months? Yes. Number of falls 4  LIVING ENVIRONMENT: Lives with: lives with an adult companion Lives in: House/apartment Stairs: Yes: Internal: 14 steps; can reach both and External: 3 steps; on right going up Has following equipment at home: Walker - 2 wheeled, quad cane,   PLOF: Independent with basic ADLs  PATIENT GOALS: improve balance/mobility/walking  OBJECTIVE:    TODAY'S TREATMENT: 10/10/2024 Activity Comments  NuStep, Level 3>4, 4 extremities x 8 minutes For aerobic warm up, >100 SPM  Review of HEP Good return demo with min verbal cues for technique (with PWR! Twist)  FTSTS:  13.38 sec Improved from 28 sec  Sit to stand from low surface simulating sofa Multiple reps with cues scooting, foot placement, use of momentum to stand  Stand at counter with minisquat to up on toes x 10 Wide BOS lateral weightshifting Side step and weightshift x 10   Gait 50 ft x 2, then 100 ft x 2 with RW Scissoring gait at times, supervision      Access Code: [email or web address] URL: [email or web address] Date: 09/29/2024 Prepared by: [facility] -  Outpatient  Rehab - [facility]  Exercises - Sit to Stand  - 1 x daily - 7 x weekly - 3 sets - 5 reps SEATED PWR! MOVES, 10 reps, once/day  PATIENT EDUCATION: Education details: Began discussion about community PD exercise with patient-he currently does not have information about options; practiced sit to stand from low surface, simulating sofa (as pt describes this is difficult) Person educated: Patient Education method: Explanation, Demonstration, Tactile cues, and Verbal cues Education comprehension: verbalized understanding, returned demonstration, verbal cues required, and needs further education  --------------------------------------- Note: Objective measures were completed at Evaluation unless otherwise noted.  DIAGNOSTIC FINDINGS:   COGNITION: Overall cognitive status: Within functional limits for tasks assessed   SENSATION: Not tested  COORDINATION: Deficits to rapid alternating movement L > R Heel to shin: incr difficulty w/ LLE    MUSCLE TONE: increased tone noted LUE/LLE    POSTURE: rounded shoulders, forward head, and flexed trunk   LOWER EXTREMITY ROM:     Active  Right Eval Left Eval  Hip flexion 110 100  Hip extension    Hip abduction    Hip adduction    Hip internal rotation    Hip external rotation    Knee flexion 120 120  Knee extension -5 -5  Ankle dorsiflexion 12 10  Ankle plantarflexion    Ankle inversion    Ankle eversion     (Blank rows = not tested)  LOWER EXTREMITY MMT:    BLE 4/5 to seated resisted tests  BED MOBILITY:  Not tested--reports independence  TRANSFERS: Sit to stand: instances retropulsion--pushes back of knees to chair/table   CURB:  Findings: SBA w/ RW  STAIRS: BHR w/ modified indep GAIT: Findings: Gait Characteristics: festinating and Comments: freezing in turns--tendency to leave foot on ground and pivot-slide around  FUNCTIONAL TESTS:  5 times sit to stand: 28 sec 10 meter walk test: 17  sec =  1.9 ft/sec Mini-BESTest: 16/28 TUG test: regular 13 sec; cognitive 17 sec (stops counting) w/ RW     PATIENT SURVEYS:  Freezing of gait questionnaire TBD                                                                                                                              TREATMENT DATE: 09/24/24    PATIENT EDUCATION: Education details: assessment details, rationale of PT intervention in regards to PD Person educated: Patient Education method: Explanation Education comprehension: verbalized understanding  HOME EXERCISE PROGRAM: TBD  GOALS: Goals reviewed with patient? Yes  SHORT TERM GOALS: Target date: 10/15/2024    Patient will be independent in HEP to improve functional outcomes Baseline: Goal status: MET, 10/10/2024  2.  Demo improved BLE strength and control with low risk for falls per time 15 sec 5xSTS test Baseline: 28 sec w/ compensation Goal status: MET, 10/10/2024   LONG TERM GOALS: Target date: 11/05/2024    Teach back relevant PD programs in community for exercise participation Baseline:  Goal status: IN PROGRESS  2.  Teach back strategies to address freezing of gait Baseline:  Goal status: IN PROGRESS  3.  Improve balance and reduce risk for falls per score 22/28 Mini-BESTest Baseline: 16/28 Goal status: IN PROGRESS  4.  Improve gait speed to 2.8 ft/sec to improve efficiency of community ambulation Baseline: 1.9 ft/sec w/ RW Goal status: IN PROGRESS    ASSESSMENT:  CLINICAL IMPRESSION: Pt presents today and reports no new changes or falls.  Skilled PT session focused on assessing STGs, with pt meeting 2 of 2 STGs.  He does report back is feeling better after movement in session today, so really discussed ways that pt can move more throughout his day and break up prolonged sitting patterns.  Also focused on low surface sit to stand, with cues for improved scooting, foot placement, and momentum to stand, pt seems pleased with this  practice.  Worked on standing activities at counter, with pt needing frequent cues to reset posture, as he becomes increasingly forward flexed.   Pt will continue to benefit from skilled PT towards goals for improved functional mobility and decreased fall risk.   EVAL:  Patient is a 68 y.o. male who was seen today for physical therapy evaluation and treatment for G20.A1 (ICD-10-CM) - Parkinson's disease without dyskinesia or fluctuating manifestations (HCC).  Exhibits symptoms of bradykinesia, postural dysfunction, high risk for falls per outcome measures, gait deviations and deficits with freezing of gait, chronic LBP, decreased gait speed, and increased need for assistance for functional mobility and ADL.  Pt would benefit from PT services to address deficits and limitations and provide relevant education for community resources.    OBJECTIVE IMPAIRMENTS: Abnormal gait, decreased activity tolerance, decreased balance, decreased coordination, decreased endurance, decreased knowledge of condition, decreased mobility, difficulty walking, decreased ROM, decreased strength, impaired tone, postural dysfunction, and pain.   ACTIVITY LIMITATIONS: carrying, lifting, bending, standing, stairs, transfers,  and locomotion level  PARTICIPATION LIMITATIONS: meal prep, cleaning, laundry, interpersonal relationship, driving, shopping, community activity, and exercise routine  PERSONAL FACTORS: Age, Time since onset of injury/illness/exacerbation, and 1-2 comorbidities: PMH including hx of lumbar spine sx, PD, HTN are also affecting patient's functional outcome.   REHAB POTENTIAL: Good  CLINICAL DECISION MAKING: Evolving/moderate complexity  EVALUATION COMPLEXITY: Moderate  PLAN:  PT FREQUENCY: 1-2x/week  PT DURATION: 6 weeks  PLANNED INTERVENTIONS: 97750- Physical Performance Testing, 97110-Therapeutic exercises, 97530- Therapeutic activity, [id number]- Neuromuscular re-education, 97535- Self Care, [id number]- Manual  therapy, [id number]- Gait training, [phone]- Canalith repositioning, [id number]- Aquatic Therapy, [id number]- Electrical stimulation (unattended), and Patient/Family education  PLAN FOR NEXT SESSION: postural re-ed for upright/unsupported stand, lower extremity strengthening and balance; standing PWR! [address]   Greig Anon, [place] 10/10/2024 11:02 AM Phone: [phone] Fax: [phone]  [facility] at [facility] [address], [address] [place], [place] [address] Phone # [phone] Fax # [phone]        "

## 2024-10-15 ENCOUNTER — Ambulatory Visit

## 2024-10-15 DIAGNOSIS — R2681 Unsteadiness on feet: Secondary | ICD-10-CM

## 2024-10-15 DIAGNOSIS — R2689 Other abnormalities of gait and mobility: Secondary | ICD-10-CM

## 2024-10-15 DIAGNOSIS — R471 Dysarthria and anarthria: Secondary | ICD-10-CM

## 2024-10-15 DIAGNOSIS — M6281 Muscle weakness (generalized): Secondary | ICD-10-CM

## 2024-10-15 DIAGNOSIS — R262 Difficulty in walking, not elsewhere classified: Secondary | ICD-10-CM

## 2024-10-15 DIAGNOSIS — R293 Abnormal posture: Secondary | ICD-10-CM

## 2024-10-15 NOTE — Therapy (Signed)
 " OUTPATIENT SPEECH LANGUAGE PATHOLOGY PARKINSON'S TREATMENT   Patient Name: [name] MRN: [id number] DOB:08/07/1956, 68 y.o., male Today's Date: 10/15/2024  PCP: Leonel Cole, MD REFERRING PROVIDER: Evonnie Stabs, DO  END OF SESSION:  End of Session - 10/15/24 1714     Visit Number 4    Number of Visits 9    Date for Recertification  10/31/24    SLP Start Time 1319    SLP Stop Time  1400    SLP Time Calculation (min) 41 min    Activity Tolerance Patient tolerated treatment well            Past Medical History:  Diagnosis Date   Allergy    Asthma    Erectile dysfunction    Hypertension    Hypopotassemia    Impotence of organic origin    Inguinal hernia without mention of obstruction or gangrene, unilateral or unspecified, (not specified as recurrent)    Pure hypercholesterolemia    Spermatocele    Urinary reflux    as child     Past Surgical History:  Procedure Laterality Date   ESOPHAGEAL DILATION  03   HERNIA REPAIR     INGUINAL HERNIA REPAIR  10/10/2012   Procedure: LAPAROSCOPIC INGUINAL HERNIA;  Surgeon: Redell Faith, DO;  Location: [facility];  Service: General;  Laterality: Right;   INSERTION OF MESH  10/10/2012   Procedure: INSERTION OF MESH;  Surgeon: Redell Faith, DO;  Location: [facility] OR;  Service: General;  Laterality: Right;   UPPER GI ENDOSCOPY  03   for food caught in thoat   There are no active problems to display for this patient.   ONSET DATE: script 08/28/24  REFERRING DIAG: Parkinson's disease  THERAPY DIAG:  Dysarthria and anarthria  Rationale for Evaluation and Treatment: Rehabilitation  SUBJECTIVE:   SUBJECTIVE STATEMENT: Pt told SLP in the last week people are telling him he sounds better.  Pt accompanied by: self  PERTINENT HISTORY: see above  PAIN:  Are you having pain? Yes: NPRS scale: 8/10 Pain location: low back Pain description: pinch Aggravating factors: sit to stand Relieving factors: rest  FALLS: Has patient  fallen in last 6 months?  Yes, See PT evaluation for details   PATIENT GOALS: Improve communication  OBJECTIVE:  Note: Objective measures were completed at Evaluation unless otherwise noted.  DIAGNOSTIC FINDINGS:  Assessment/Plan:  TAT- 08/28/24  1.  Parkinsons Disease             - Patient looks significantly worse than last visit (he looks like he did prior to starting medication for Parkinson's).  This is likely because he is not taking medication as directed.             - Discussed importance of compliance with medication and follow-up visits.  He is reporting he takes medication faithfully but pramipexole /primidone  hasn't been filled since 2023 and 2024.  Even after talking about this, he still reports that he has been taking it faithfully.             - restart carbidopa /levodopa  25/100, 1 po tid.  He is currently only taking it once per day.             -may need to restart pramipexole  but we will hold on restarting that for now.             - I asked him to bring me his bottles next visit             -  Referred to physical and speech therapy             - Discussed Parkinson's disease in detail and the things that it can affect.  Friend was with him today and stated that he did not even know he had Parkinson's disease, even though he delivers his medication to him.             -he has quit drinking alcohol all together and both patient/friend agree that he has not been drinking alcohol.  Proud of him for that.             - Talked to the patient extensively about his sleep/wake schedule.  I told him he needed to be up and out of the bed by 10 AM.  2.  LBP - Following with [facility]   3.  F/u 6 months   Subjective:    [name] was seen today in follow up.  Pt with friend, [name], who supplements hx.  Patient has not been seen in a year because of canceled appointments.  His primary care physician has been refilling his levodopa .  He is no longer on  the pramipexole .  Dispense reports indicate that this was last filled April, 2024 for a 82-month supply (although patient told me he was on it when I saw him last October).  Notes from primary care indicate patient had stopped his levodopa  for several months, but then his condition worsened and wanted to go back on medication and his primary care physician told him to follow-up here.  Pt states that he is only taking 1 levodopa  per day.  He reports he is still on the pramipexole , although last filled 01/2023.  He also states that he is on primidone .  We stopped that previously and it was last filled 07/2022.     Current movement disorder medications: Carbidopa /levodopa  25/100, 1 tablet 3 times per day (6am/11:30pm/5pm) Pramipexole  0.5 mg 3 times per day.     Prior medications: Primidone ; pramipexole   MR BRAIN WO CONT 08/07/22 IMPRESSION: 1. No acute intracranial abnormality. 2. 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    PATIENT REPORTED OUTCOME MEASURES (PROM): Communication Effectiveness Survey: provided 10/15/24 . Pt scored 21/32 indicating that there is some reduction in effectiveness of his speech/communication, especially in situations with a noisy environment, when emotionally upset, or when talking across a room.                                                                                                                            TREATMENT DATE:   Abdominal breathing=AB, SO=SpeakOut!, CES = Communication Effectiveness Survey  10/15/24: Given pt's s statement SLP congratulated him on speaking out, outside of ST. SLP guided pt through CES with above score. SLP trgeted volume and intelligibility using Speak Out! components with the following dB averages: M-words: 85, Sustained AH: 82, Glides: 80, Counting: 85 dB. Pt required consistent  model by SLP faded to occasional min-mod verbal cues for m-words, initial cues for /a/ for volume, initial  modeling by SLP for glides faded to usual mod A, and modeling faded to occasional mod A for numbers. SLP told pt to cont using intentional and purposeful speech in conversation.   10/08/24: Pt needs PROM next session. SLP used SO to improve pt's level of intent when talking, and speech clarity. Pt required consistent faded to occasional modeling, and consistent faded to occasional min verbal cues for volume and breath support. Pt utilized SO approx 70% of productions today. SLP provided pt homework related to SO. Today SLP introduced pt to s/sx swallowing difficulty. He told SLP some of these with mod I.   10/02/24: SLP initiated SPEAK OUT! Program.  Targeted volume and intelligibility using Speak Out! Lesson 1 and 2. Pt required frequent min verbal cues, modeling, for volume and breath support.   Pt averages the following volume levels:  Sustained AH: 82 dB  Counting:85 dB  Reading (phrases): 82 dB  Cognitive Exercise: 74 dB Required frequent min verbal cues, modeling for carryover of intent /volume answering simple questions following structured practice. SLP provided pt homework related to SPEAKOUT! Program. Plan is to get pt formal HEP for SO! to promote generalization of speaking with intent. In addition, plan is to continue SO! Program for optimizing speech production for QOL.  09/23/24: n/a   PATIENT EDUCATION: Education details: see treatment date Person educated: Patient Education method: Explanation Education comprehension: verbalized understanding  HOME EXERCISE PROGRAM: Eventually, AB and phonatory effort tasks   GOALS: Goals reviewed with patient? Yes  SHORT TERM GOALS: =  LONG TERM GOALS: Target date: 10/31/24  Pt will demo HEP with mod I in 2 sessions Baseline:  Goal status: ONGOING  2.  Pt will improve PROM Baseline:  Goal status: ONGOING  3.  Pt will tell SLP 3 common overt s/sx swallowing difficulties in PD with mod I Baseline:  Goal status: met  4.   Pt will maintain WNL volume and AB 70% in 15 minutes conversation in 2 sessions  Baseline:  Goal status: ONGOING  5.  Pt and/or roommate will tell SLP how to maximize conversational effectiveness Baseline:  Goal status: ONGOING    ASSESSMENT:  CLINICAL IMPRESSION: Patient is a 68 y.o. M who was seen today for treatment of communication in light of PD. See treatment date above for today's date for further details on today's session.  Pt's volume on day of eval was WNL (70dB) but after 17 minutes of min-mod complex conversation became more variable in nature with average 69dB. He would benefit from a short course of ST to educate him about maximizing conversational effectiveness as he states his roommate tells him that he is difficult to understand, initiating some exercises for maintaining strong speaking voice, as well as learning how to engage abdominal musculature when speaking (pt was breathing with thoracic musculature). Today pt was 100% intelligible but used abdominal breathing only approx 15% of the time.   OBJECTIVE IMPAIRMENTS: Objective impairments include dysarthria. These impairments are limiting patient from ADLs/IADLs and effectively communicating at home and in community.Factors affecting potential to achieve goals and functional outcome are family/community support.. Patient will benefit from skilled SLP services to address above impairments and improve overall function.  REHAB POTENTIAL: Good  PLAN:  SLP FREQUENCY: 2x/week  SLP DURATION: 4 weeks  PLANNED INTERVENTIONS: Functional tasks, SLP instruction and feedback, Compensatory strategies, Patient/family education, [phone] Treatment of speech (30 or  45 min) , and speech exercises    Waddell [employer], CF-SLP 10/15/2024, 5:15 PM  .bfneuro    "

## 2024-10-15 NOTE — Therapy (Signed)
 " OUTPATIENT PHYSICAL THERAPY NEURO TREATMENT NOTE   Patient Name: Bartolo Montanye MRN: [id number] DOB:05-23-1956, 68 y.o., male Today's Date: 10/15/2024   PCP: Leonel Cole, MD REFERRING PROVIDER: Evonnie Asberry RAMAN, DO  END OF SESSION:  PT End of Session - 10/15/24 1351     Visit Number 4    Number of Visits 12    Date for Recertification  11/04/24    Authorization Type [employer]    Authorization Time Period **Auth#: [id number], approved 7 PT visits from 09/23/2024 - 12/16/2024.**    Authorization - Visit Number 4    Authorization - Number of Visits 7    Progress Note Due on Visit 10    PT Start Time 1400    PT Stop Time 1445    PT Time Calculation (min) 45 min    Equipment Utilized During Treatment Gait belt    Activity Tolerance Patient tolerated treatment well    Behavior During Therapy WFL for tasks assessed/performed            Past Medical History:  Diagnosis Date   Allergy    Asthma    Erectile dysfunction    Hypertension    Hypopotassemia    Impotence of organic origin    Inguinal hernia without mention of obstruction or gangrene, unilateral or unspecified, (not specified as recurrent)    Pure hypercholesterolemia    Spermatocele    Urinary reflux    as child     Past Surgical History:  Procedure Laterality Date   ESOPHAGEAL DILATION  03   HERNIA REPAIR     INGUINAL HERNIA REPAIR  10/10/2012   Procedure: LAPAROSCOPIC INGUINAL HERNIA;  Surgeon: Redell Faith, DO;  Location: [facility];  Service: General;  Laterality: Right;   INSERTION OF MESH  10/10/2012   Procedure: INSERTION OF MESH;  Surgeon: Redell Faith, DO;  Location: [facility];  Service: General;  Laterality: Right;   UPPER GI ENDOSCOPY  03   for food caught in thoat   There are no active problems to display for this patient.   ONSET DATE:   REFERRING DIAG:  G20.A1 (ICD-10-CM) - Parkinson's disease without dyskinesia or fluctuating manifestations (HCC)    THERAPY DIAG:   Unsteadiness on feet  Other abnormalities of gait and mobility  Muscle weakness (generalized)  Difficulty in walking, not elsewhere classified  Abnormal posture  Rationale for Evaluation and Treatment: Rehabilitation  SUBJECTIVE:  SUBJECTIVE STATEMENT: Doing ok, had a check up with spine MD yesterday and return end of January for lumbar injection Pt accompanied by: self  PERTINENT HISTORY:  L THR and lumbar surgery about 5 years ago PAIN:  Are you having pain? Yes: NPRS scale: 5/10 Pain location: low back Pain description: pinch Aggravating factors: sit to stand Relieving factors: rest  PRECAUTIONS: Fall  RED FLAGS: None   WEIGHT BEARING RESTRICTIONS: No  FALLS: Has patient fallen in last 6 months? Yes. Number of falls 4  LIVING ENVIRONMENT: Lives with: lives with an adult companion Lives in: House/apartment Stairs: Yes: Internal: 14 steps; can reach both and External: 3 steps; on right going up Has following equipment at home: Walker - 2 wheeled, quad cane,   PLOF: Independent with basic ADLs  PATIENT GOALS: improve balance/mobility/walking  OBJECTIVE:   TODAY'S TREATMENT: 10/15/24 Activity Comments  NU-step level 5 x 6 min Maintains 100 SPM  Postural re-education -standing back to door frame and working on trunk extension with various activities for upper body strength to divide attention and improve ability to sustain upright posture against perturbations  Dynamic standing balance To promote large amplitude movement and single limb support  Static multisensory balance  EO/EC on firm/foam Postural perturbations on firm/foam Standing on slope x 30 sec EO/EC  Gastroc stretch 2x60 sec On slantboard to increase ankle DF for standing balance on sloped surface             TODAY'S TREATMENT: 10/10/2024 Activity Comments  NuStep, Level 3>4, 4 extremities x 8 minutes For aerobic warm up, >100 SPM  Review of HEP Good return demo with min verbal cues for technique (with PWR! Twist)  FTSTS:  13.38 sec Improved from 28 sec  Sit to stand from low surface simulating sofa Multiple reps with cues scooting, foot placement, use of momentum to stand  Stand at counter with minisquat to up on toes x 10 Wide BOS lateral weightshifting Side step and weightshift x 10   Gait 50 ft x 2, then 100 ft x 2 with RW Scissoring gait at times, supervision      Access Code: [email or web address] URL: [email or web address] Date: 09/29/2024 Prepared by: [facility] - [facility]  Exercises - Sit to Stand  - 1 x daily - 7 x weekly - 3 sets - 5 reps SEATED PWR! MOVES, 10 reps, once/day - Stretching the Involved Hip and Ankle in Doorframe Standing in Stride  - 30-60 sec hold  PATIENT EDUCATION: Education details: Began discussion about community PD exercise with patient-he currently does not have information about options; practiced sit to stand from low surface, simulating sofa (as pt describes this is difficult) Person educated: Patient Education method: Explanation, Demonstration, Tactile cues, and Verbal cues Education comprehension: verbalized understanding, returned demonstration, verbal cues required, and needs further education  --------------------------------------- Note: Objective measures were completed at Evaluation unless otherwise noted.  DIAGNOSTIC FINDINGS:   COGNITION: Overall cognitive status: Within functional limits for tasks assessed   SENSATION: Not tested  COORDINATION: Deficits to rapid alternating movement L > R Heel to shin: incr difficulty w/ LLE    MUSCLE TONE: increased tone noted LUE/LLE    POSTURE: rounded shoulders, forward head, and flexed trunk   LOWER EXTREMITY ROM:     Active  Right Eval  Left Eval  Hip flexion 110 100  Hip extension    Hip abduction    Hip adduction    Hip internal rotation    Hip  external rotation    Knee flexion 120 120  Knee extension -5 -5  Ankle dorsiflexion 12 10  Ankle plantarflexion    Ankle inversion    Ankle eversion     (Blank rows = not tested)  LOWER EXTREMITY MMT:    BLE 4/5 to seated resisted tests  BED MOBILITY:  Not tested--reports independence  TRANSFERS: Sit to stand: instances retropulsion--pushes back of knees to chair/table   CURB:  Findings: SBA w/ RW  STAIRS: BHR w/ modified indep GAIT: Findings: Gait Characteristics: festinating and Comments: freezing in turns--tendency to leave foot on ground and pivot-slide around  FUNCTIONAL TESTS:  5 times sit to stand: 28 sec 10 meter walk test: 17 sec = 1.9 ft/sec Mini-BESTest: 16/28 TUG test: regular 13 sec; cognitive 17 sec (stops counting) w/ RW     PATIENT SURVEYS:  Freezing of gait questionnaire TBD                                                                                                                              TREATMENT DATE: 09/24/24    PATIENT EDUCATION: Education details: assessment details, rationale of PT intervention in regards to PD Person educated: Patient Education method: Explanation Education comprehension: verbalized understanding  HOME EXERCISE PROGRAM: TBD  GOALS: Goals reviewed with patient? Yes  SHORT TERM GOALS: Target date: 10/15/2024    Patient will be independent in HEP to improve functional outcomes Baseline: Goal status: MET, 10/10/2024  2.  Demo improved BLE strength and control with low risk for falls per time 15 sec 5xSTS test Baseline: 28 sec w/ compensation Goal status: MET, 10/10/2024   LONG TERM GOALS: Target date: 11/05/2024    Teach back relevant PD programs in community for exercise participation Baseline:  Goal status: IN PROGRESS  2.  Teach back strategies to address freezing of  gait Baseline:  Goal status: IN PROGRESS  3.  Improve balance and reduce risk for falls per score 22/28 Mini-BESTest Baseline: 16/28 Goal status: IN PROGRESS  4.  Improve gait speed to 2.8 ft/sec to improve efficiency of community ambulation Baseline: 1.9 ft/sec w/ RW Goal status: IN PROGRESS    ASSESSMENT:  CLINICAL IMPRESSION: NU-step to facilitate rapid alternating movement for enhanced coordination with great control and tolerance maintaining 100 SPM throughout. Postural re-education techniques to improve upright posture/position to improve balance/reduce fall risk and ability for environmental awareness/scanning.  Difficulty with generalizing upright posture when physical support is removed or when multitasking is involved with progressive trunk flexion but improved with verbal and better yet with tactile cue of pulling shoulders back to surface. Reinforced upright posture during static balance to generalize ability to unsupported standing but with limited endurance and progressive trunk flexion especially with standing on sloped surfaces requiring body position to accommodate.  Provided update to HEP for standing posture practice at home with good teach-back  EVAL:  Patient is a 68 y.o. male who was seen today for physical therapy  evaluation and treatment for G20.A1 (ICD-10-CM) - Parkinson's disease without dyskinesia or fluctuating manifestations (HCC).  Exhibits symptoms of bradykinesia, postural dysfunction, high risk for falls per outcome measures, gait deviations and deficits with freezing of gait, chronic LBP, decreased gait speed, and increased need for assistance for functional mobility and ADL.  Pt would benefit from PT services to address deficits and limitations and provide relevant education for community resources.    OBJECTIVE IMPAIRMENTS: Abnormal gait, decreased activity tolerance, decreased balance, decreased coordination, decreased endurance, decreased knowledge of  condition, decreased mobility, difficulty walking, decreased ROM, decreased strength, impaired tone, postural dysfunction, and pain.   ACTIVITY LIMITATIONS: carrying, lifting, bending, standing, stairs, transfers, and locomotion level  PARTICIPATION LIMITATIONS: meal prep, cleaning, laundry, interpersonal relationship, driving, shopping, community activity, and exercise routine  PERSONAL FACTORS: Age, Time since onset of injury/illness/exacerbation, and 1-2 comorbidities: PMH including hx of lumbar spine sx, PD, HTN are also affecting patient's functional outcome.   REHAB POTENTIAL: Good  CLINICAL DECISION MAKING: Evolving/moderate complexity  EVALUATION COMPLEXITY: Moderate  PLAN:  PT FREQUENCY: 1-2x/week  PT DURATION: 6 weeks  PLANNED INTERVENTIONS: 97750- Physical Performance Testing, 97110-Therapeutic exercises, 97530- Therapeutic activity, [id number]- Neuromuscular re-education, 97535- Self Care, [id number]- Manual therapy, [id number]- Gait training, [phone]- Canalith repositioning, [id number]- Aquatic Therapy, [id number]- Electrical stimulation (unattended), and Patient/Family education  PLAN FOR NEXT SESSION: postural re-ed for upright/unsupported stand, lower extremity strengthening and balance; standing PWR! Moves   2:55 PM, 10/15/2024 M. Kelly Javarus Dorner, PT, DPT Physical Therapist- [place] Office Number: [phone]         "

## 2024-10-17 ENCOUNTER — Ambulatory Visit

## 2024-10-17 ENCOUNTER — Ambulatory Visit: Attending: Neurology

## 2024-10-17 DIAGNOSIS — M6281 Muscle weakness (generalized): Secondary | ICD-10-CM | POA: Insufficient documentation

## 2024-10-17 DIAGNOSIS — R2689 Other abnormalities of gait and mobility: Secondary | ICD-10-CM

## 2024-10-17 DIAGNOSIS — R262 Difficulty in walking, not elsewhere classified: Secondary | ICD-10-CM

## 2024-10-17 DIAGNOSIS — R2681 Unsteadiness on feet: Secondary | ICD-10-CM | POA: Insufficient documentation

## 2024-10-17 DIAGNOSIS — R293 Abnormal posture: Secondary | ICD-10-CM

## 2024-10-17 DIAGNOSIS — R471 Dysarthria and anarthria: Secondary | ICD-10-CM | POA: Insufficient documentation

## 2024-10-17 NOTE — Therapy (Signed)
 " OUTPATIENT SPEECH LANGUAGE PATHOLOGY PARKINSON'S TREATMENT   Patient Name: [name] MRN: [id number] DOB:1956-08-07, 69 y.o., male Today's Date: 10/17/2024  PCP: Leonel Cole, MD REFERRING PROVIDER: Evonnie Stabs, DO  END OF SESSION:  End of Session - 10/17/24 0945     Visit Number 5    Number of Visits 9    Date for Recertification  10/31/24    SLP Start Time 0941    SLP Stop Time  1016    SLP Time Calculation (min) 35 min    Activity Tolerance Patient tolerated treatment well            Past Medical History:  Diagnosis Date   Allergy    Asthma    Erectile dysfunction    Hypertension    Hypopotassemia    Impotence of organic origin    Inguinal hernia without mention of obstruction or gangrene, unilateral or unspecified, (not specified as recurrent)    Pure hypercholesterolemia    Spermatocele    Urinary reflux    as child     Past Surgical History:  Procedure Laterality Date   ESOPHAGEAL DILATION  03   HERNIA REPAIR     INGUINAL HERNIA REPAIR  10/10/2012   Procedure: LAPAROSCOPIC INGUINAL HERNIA;  Surgeon: Redell Faith, DO;  Location: [facility] OR;  Service: General;  Laterality: Right;   INSERTION OF MESH  10/10/2012   Procedure: INSERTION OF MESH;  Surgeon: Redell Faith, DO;  Location: [facility] OR;  Service: General;  Laterality: Right;   UPPER GI ENDOSCOPY  03   for food caught in thoat   There are no active problems to display for this patient.   ONSET DATE: script 08/28/24  REFERRING DIAG: Parkinson's disease  THERAPY DIAG:  Dysarthria and anarthria  Rationale for Evaluation and Treatment: Rehabilitation  SUBJECTIVE:   SUBJECTIVE STATEMENT: Pt told SLP in the last week people are telling him he sounds better.  Pt accompanied by: self  PERTINENT HISTORY: see above  PAIN:  Are you having pain? Yes: NPRS scale: 6/10 Pain location: lower back Pain description: pinch, soreness Aggravating factors: sit to stand Relieving factors: rest  FALLS: Has  patient fallen in last 6 months?  Yes, See PT evaluation for details   PATIENT GOALS: Improve communication  OBJECTIVE:  Note: Objective measures were completed at Evaluation unless otherwise noted.  DIAGNOSTIC FINDINGS:  Assessment/Plan:  TAT- 08/28/24  1.  Parkinsons Disease             - Patient looks significantly worse than last visit (he looks like he did prior to starting medication for Parkinson's).  This is likely because he is not taking medication as directed.             - Discussed importance of compliance with medication and follow-up visits.  He is reporting he takes medication faithfully but pramipexole /primidone  hasn't been filled since 2023 and 2024.  Even after talking about this, he still reports that he has been taking it faithfully.             - restart carbidopa /levodopa  25/100, 1 po tid.  He is currently only taking it once per day.             -may need to restart pramipexole  but we will hold on restarting that for now.             - I asked him to bring me his bottles next visit             -  Referred to physical and speech therapy             - Discussed Parkinson's disease in detail and the things that it can affect.  Friend was with him today and stated that he did not even know he had Parkinson's disease, even though he delivers his medication to him.             -he has quit drinking alcohol all together and both patient/friend agree that he has not been drinking alcohol.  Proud of him for that.             - Talked to the patient extensively about his sleep/wake schedule.  I told him he needed to be up and out of the bed by 10 AM.  2.  LBP - Following with [facility]   3.  F/u 6 months   Subjective:    [name] was seen today in follow up.  Pt with friend, [name], who supplements hx.  Patient has not been seen in a year because of canceled appointments.  His primary care physician has been refilling his levodopa .  He is no  longer on the pramipexole .  Dispense reports indicate that this was last filled April, 2024 for a 58-month supply (although patient told me he was on it when I saw him last October).  Notes from primary care indicate patient had stopped his levodopa  for several months, but then his condition worsened and wanted to go back on medication and his primary care physician told him to follow-up here.  Pt states that he is only taking 1 levodopa  per day.  He reports he is still on the pramipexole , although last filled 01/2023.  He also states that he is on primidone .  We stopped that previously and it was last filled 07/2022.     Current movement disorder medications: Carbidopa /levodopa  25/100, 1 tablet 3 times per day (6am/11:30pm/5pm) Pramipexole  0.5 mg 3 times per day.     Prior medications: Primidone ; pramipexole   MR BRAIN WO CONT 08/07/22 IMPRESSION: 1. No acute intracranial abnormality. 2. 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    PATIENT REPORTED OUTCOME MEASURES (PROM): Communication Effectiveness Survey: provided 10/15/24 . Pt scored 21/32 indicating that there is some reduction in effectiveness of his speech/communication, especially in situations with a noisy environment, when emotionally upset, or when talking across a room.                                                                                                                            TREATMENT DATE:   Abdominal breathing=AB, SO=SpeakOut!, CES = Communication Effectiveness Survey  10/17/24: Pt cont to practice.He (roommate) is saying I can understand you better. [name] attributes this to looking at the listener, and speaking with better volume as he thinks about speaking out.  SLP targeted volume and intelligibility using Speak Out! components with the following dB averages: M-words: 86,  Sustained AH: 86, Glides: 80, Counting: 84dB. Pt required consistent model by SLP faded to occasional mod  verbal cues forall exercises to incr'd intent to slow his rate (m-words and numbers), for holding pitches, and upward glide and downward glides instead of up and then back down again. In sentence tasks SLP provided initial cues for intent and he was successful with WNL volume 90% of the time.   10/15/24: Given pt's s statement SLP congratulated him on speaking out, outside of ST. SLP guided pt through CES with above score. SLP targeted volume and intelligibility using Speak Out! components with the following dB averages: M-words: 85, Sustained AH: 82, Glides: 80, Counting: 85 dB. Pt required consistent model by SLP faded to occasional min-mod verbal cues for m-words, initial cues for /a/ for volume, initial modeling by SLP for glides faded to usual mod A, and modeling faded to occasional mod A for numbers. SLP told pt to cont using intentional and purposeful speech in conversation.   10/08/24: Pt needs PROM next session. SLP used SO to improve pt's level of intent when talking, and speech clarity. Pt required consistent faded to occasional modeling, and consistent faded to occasional min verbal cues for volume and breath support. Pt utilized SO approx 70% of productions today. SLP provided pt homework related to SO. Today SLP introduced pt to s/sx swallowing difficulty. He told SLP some of these with mod I.   10/02/24: SLP initiated SPEAK OUT! Program.  Targeted volume and intelligibility using Speak Out! Lesson 1 and 2. Pt required frequent min verbal cues, modeling, for volume and breath support.   Pt averages the following volume levels:  Sustained AH: 82 dB  Counting:85 dB  Reading (phrases): 82 dB  Cognitive Exercise: 74 dB Required frequent min verbal cues, modeling for carryover of intent /volume answering simple questions following structured practice. SLP provided pt homework related to SPEAKOUT! Program. Plan is to get pt formal HEP for SO! to promote generalization of speaking with  intent. In addition, plan is to continue SO! Program for optimizing speech production for QOL.  09/23/24: n/a   PATIENT EDUCATION: Education details: see treatment date Person educated: Patient Education method: Explanation Education comprehension: verbalized understanding  HOME EXERCISE PROGRAM: Eventually, AB and phonatory effort tasks   GOALS: Goals reviewed with patient? Yes  SHORT TERM GOALS: =  LONG TERM GOALS: Target date: 10/31/24  Pt will demo HEP with mod I in 2 sessions Baseline:  Goal status: ONGOING  2.  Pt will improve PROM Baseline:  Goal status: ONGOING  3.  Pt will tell SLP 3 common overt s/sx swallowing difficulties in PD with mod I Baseline:  Goal status: met  4.  Pt will maintain WNL volume and AB 70% in 15 minutes conversation in 2 sessions  Baseline:  Goal status: ONGOING  5.  Pt and/or roommate will tell SLP how to maximize conversational effectiveness Baseline:  Goal status: ONGOING    ASSESSMENT:  CLINICAL IMPRESSION: Patient is a 69 y.o. M who was seen today for treatment of communication in light of PD. See treatment date above for today's date for further details on today's session.  Pt's volume on day of eval was WNL (70dB) but after 17 minutes of min-mod complex conversation became more variable in nature with average 69dB. He would benefit from a short course of ST to educate him about maximizing conversational effectiveness as he states his roommate tells him that he is difficult to understand, initiating some exercises for  maintaining strong speaking voice, as well as learning how to engage abdominal musculature when speaking (pt was breathing with thoracic musculature). Today pt was 100% intelligible but used abdominal breathing only approx 15% of the time.   OBJECTIVE IMPAIRMENTS: Objective impairments include dysarthria. These impairments are limiting patient from ADLs/IADLs and effectively communicating at home and in  community.Factors affecting potential to achieve goals and functional outcome are family/community support.. Patient will benefit from skilled SLP services to address above impairments and improve overall function.  REHAB POTENTIAL: Good  PLAN:  SLP FREQUENCY: 2x/week  SLP DURATION: 4 weeks  PLANNED INTERVENTIONS: Functional tasks, SLP instruction and feedback, Compensatory strategies, Patient/family education, [id number] Treatment of speech (30 or 45 min) , and speech exercises    [name], CF-SLP 10/17/2024, 9:46 AM  .[email or web address]    "

## 2024-10-17 NOTE — Therapy (Signed)
 " OUTPATIENT PHYSICAL THERAPY NEURO TREATMENT NOTE   Patient Name: [name] MRN: [id number] DOB:09/05/1956, 69 y.o., male Today's Date: 10/17/2024   PCP: Leonel Cole, MD REFERRING PROVIDER: Evonnie Asberry RAMAN, DO  END OF SESSION:  PT End of Session - 10/17/24 1021     Visit Number 5    Number of Visits 12    Date for Recertification  11/04/24    Authorization Type [employer]    Authorization Time Period **Auth#: [id number], approved 7 PT visits from 09/23/2024 - 12/16/2024.**    Authorization - Visit Number 5    Authorization - Number of Visits 7    Progress Note Due on Visit 10    PT Start Time 1015    PT Stop Time 1100    PT Time Calculation (min) 45 min    Equipment Utilized During Treatment Gait belt    Activity Tolerance Patient tolerated treatment well    Behavior During Therapy WFL for tasks assessed/performed            Past Medical History:  Diagnosis Date   Allergy    Asthma    Erectile dysfunction    Hypertension    Hypopotassemia    Impotence of organic origin    Inguinal hernia without mention of obstruction or gangrene, unilateral or unspecified, (not specified as recurrent)    Pure hypercholesterolemia    Spermatocele    Urinary reflux    as child     Past Surgical History:  Procedure Laterality Date   ESOPHAGEAL DILATION  03   HERNIA REPAIR     INGUINAL HERNIA REPAIR  10/10/2012   Procedure: LAPAROSCOPIC INGUINAL HERNIA;  Surgeon: Redell Faith, DO;  Location: [facility];  Service: General;  Laterality: Right;   INSERTION OF MESH  10/10/2012   Procedure: INSERTION OF MESH;  Surgeon: Redell Faith, DO;  Location: [facility];  Service: General;  Laterality: Right;   UPPER GI ENDOSCOPY  03   for food caught in thoat   There are no active problems to display for this patient.   ONSET DATE:   REFERRING DIAG:  G20.A1 (ICD-10-CM) - Parkinson's disease without dyskinesia or fluctuating manifestations (HCC)    THERAPY DIAG:  Unsteadiness  on feet  Other abnormalities of gait and mobility  Muscle weakness (generalized)  Difficulty in walking, not elsewhere classified  Abnormal posture  Rationale for Evaluation and Treatment: Rehabilitation  SUBJECTIVE:  SUBJECTIVE STATEMENT: Doing well, not too sore after last session, low back is sore but not worse Pt accompanied by: self  PERTINENT HISTORY:  L THR and lumbar surgery about 5 years ago PAIN:  Are you having pain? Yes: NPRS scale: 5/10 Pain location: low back Pain description: pinch Aggravating factors: sit to stand Relieving factors: rest  PRECAUTIONS: Fall  RED FLAGS: None   WEIGHT BEARING RESTRICTIONS: No  FALLS: Has patient fallen in last 6 months? Yes. Number of falls 4  LIVING ENVIRONMENT: Lives with: lives with an adult companion Lives in: House/apartment Stairs: Yes: Internal: 14 steps; can reach both and External: 3 steps; on right going up Has following equipment at home: Walker - 2 wheeled, quad cane,   PLOF: Independent with basic ADLs  PATIENT GOALS: improve balance/mobility/walking  OBJECTIVE:   TODAY'S TREATMENT: 10/17/24 Activity Comments  NU-step speed intervals level 5 x 8 min 2 min warm-up 30 sec sprint; 60 sec recovery  Seated PWR moves 2x10   Standing PWR moves -video instruction -1x10 review, imbalance with change of direction movements  Dynamic standing balance   Static multisensory balance EO/EC on firm/foam Postural perturbations on firm/foam Standing on slope x 30 sec EO/EC        TODAY'S TREATMENT: 10/15/24 Activity Comments  NU-step level 5 x 6 min Maintains 100 SPM  Postural re-education -standing back to door frame and working on trunk extension with various activities for upper body strength to divide attention and improve  ability to sustain upright posture against perturbations  Dynamic standing balance To promote large amplitude movement and single limb support  Static multisensory balance  EO/EC on firm/foam Postural perturbations on firm/foam Standing on slope x 30 sec EO/EC  Gastroc stretch 2x60 sec On slantboard to increase ankle DF for standing balance on sloped surface            TODAY'S TREATMENT: 10/10/2024 Activity Comments  NuStep, Level 3>4, 4 extremities x 8 minutes For aerobic warm up, >100 SPM  Review of HEP Good return demo with min verbal cues for technique (with PWR! Twist)  FTSTS:  13.38 sec Improved from 28 sec  Sit to stand from low surface simulating sofa Multiple reps with cues scooting, foot placement, use of momentum to stand  Stand at counter with minisquat to up on toes x 10 Wide BOS lateral weightshifting Side step and weightshift x 10   Gait 50 ft x 2, then 100 ft x 2 with RW Scissoring gait at times, supervision      Access Code: [email or web address] URL: [email or web address] Date: 09/29/2024 Prepared by: [facility] Outpatient  Rehab - [facility]  Exercises - Sit to Stand  - 1 x daily - 7 x weekly - 3 sets - 5 reps SEATED PWR! MOVES, 10 reps, once/day - Stretching the Involved Hip and Ankle in Doorframe Standing in Stride  - 30-60 sec hold  PATIENT EDUCATION: Education details: Began discussion about community PD exercise with patient-he currently does not have information about options; practiced sit to stand from low surface, simulating sofa (as pt describes this is difficult) Person educated: Patient Education method: Explanation, Demonstration, Tactile cues, and Verbal cues Education comprehension: verbalized understanding, returned demonstration, verbal cues required, and needs further education  --------------------------------------- Note: Objective measures were completed at Evaluation unless otherwise noted.  DIAGNOSTIC FINDINGS:    COGNITION: Overall cognitive status: Within functional limits for tasks assessed   SENSATION: Not tested  COORDINATION: Deficits to rapid alternating movement L > R  Heel to shin: incr difficulty w/ LLE    MUSCLE TONE: increased tone noted LUE/LLE    POSTURE: rounded shoulders, forward head, and flexed trunk   LOWER EXTREMITY ROM:     Active  Right Eval Left Eval  Hip flexion 110 100  Hip extension    Hip abduction    Hip adduction    Hip internal rotation    Hip external rotation    Knee flexion 120 120  Knee extension -5 -5  Ankle dorsiflexion 12 10  Ankle plantarflexion    Ankle inversion    Ankle eversion     (Blank rows = not tested)  LOWER EXTREMITY MMT:    BLE 4/5 to seated resisted tests  BED MOBILITY:  Not tested--reports independence  TRANSFERS: Sit to stand: instances retropulsion--pushes back of knees to chair/table   CURB:  Findings: SBA w/ RW  STAIRS: BHR w/ modified indep GAIT: Findings: Gait Characteristics: festinating and Comments: freezing in turns--tendency to leave foot on ground and pivot-slide around  FUNCTIONAL TESTS:  5 times sit to stand: 28 sec 10 meter walk test: 17 sec = 1.9 ft/sec Mini-BESTest: 16/28 TUG test: regular 13 sec; cognitive 17 sec (stops counting) w/ RW     PATIENT SURVEYS:  Freezing of gait questionnaire TBD                                                                                                                              TREATMENT DATE: 09/24/24    PATIENT EDUCATION: Education details: assessment details, rationale of PT intervention in regards to PD Person educated: Patient Education method: Explanation Education comprehension: verbalized understanding  HOME EXERCISE PROGRAM: TBD  GOALS: Goals reviewed with patient? Yes  SHORT TERM GOALS: Target date: 10/15/2024    Patient will be independent in HEP to improve functional outcomes Baseline: Goal status: MET, 10/10/2024  2.   Demo improved BLE strength and control with low risk for falls per time 15 sec 5xSTS test Baseline: 28 sec w/ compensation Goal status: MET, 10/10/2024   LONG TERM GOALS: Target date: 11/05/2024    Teach back relevant PD programs in community for exercise participation Baseline:  Goal status: IN PROGRESS  2.  Teach back strategies to address freezing of gait Baseline:  Goal status: IN PROGRESS  3.  Improve balance and reduce risk for falls per score 22/28 Mini-BESTest Baseline: 16/28 Goal status: IN PROGRESS  4.  Improve gait speed to 2.8 ft/sec to improve efficiency of community ambulation Baseline: 1.9 ft/sec w/ RW Goal status: IN PROGRESS    ASSESSMENT:  CLINICAL IMPRESSION: NU-step speed intervals with great maintenance for speed and transitions able to perform independently after initial instruction. Seated and standing large amplitude movements to improve coordination, flexibility, and balance.  Good recall to seated movements w/ supervision for verbal cues for form/sequence.  Standing large amplitude movements with UE support 25-50% of the time and cues for postural position/control with difficulty in  isolated control requiring increased time to assume positions. Improved control to static standing balance/postural control w/ mild sway.  Did not advance HEP for standing large amplitude as additional practice would be helpful for safe performance at home.   EVAL:  Patient is a 69 y.o. male who was seen today for physical therapy evaluation and treatment for G20.A1 (ICD-10-CM) - Parkinson's disease without dyskinesia or fluctuating manifestations (HCC).  Exhibits symptoms of bradykinesia, postural dysfunction, high risk for falls per outcome measures, gait deviations and deficits with freezing of gait, chronic LBP, decreased gait speed, and increased need for assistance for functional mobility and ADL.  Pt would benefit from PT services to address deficits and limitations and  provide relevant education for community resources.    OBJECTIVE IMPAIRMENTS: Abnormal gait, decreased activity tolerance, decreased balance, decreased coordination, decreased endurance, decreased knowledge of condition, decreased mobility, difficulty walking, decreased ROM, decreased strength, impaired tone, postural dysfunction, and pain.   ACTIVITY LIMITATIONS: carrying, lifting, bending, standing, stairs, transfers, and locomotion level  PARTICIPATION LIMITATIONS: meal prep, cleaning, laundry, interpersonal relationship, driving, shopping, community activity, and exercise routine  PERSONAL FACTORS: Age, Time since onset of injury/illness/exacerbation, and 1-2 comorbidities: PMH including hx of lumbar spine sx, PD, HTN are also affecting patient's functional outcome.   REHAB POTENTIAL: Good  CLINICAL DECISION MAKING: Evolving/moderate complexity  EVALUATION COMPLEXITY: Moderate  PLAN:  PT FREQUENCY: 1-2x/week  PT DURATION: 6 weeks  PLANNED INTERVENTIONS: 97750- Physical Performance Testing, 97110-Therapeutic exercises, 97530- Therapeutic activity, [id number]- Neuromuscular re-education, 97535- Self Care, 02859- Manual therapy, [id number]- Gait training, [id number]- Canalith repositioning, [id number]- Aquatic Therapy, [id number]- Electrical stimulation (unattended), and Patient/Family education  PLAN FOR NEXT SESSION: postural re-ed for upright/unsupported stand, lower extremity strengthening and balance; standing PWR! Moves   10:22 AM, 10/17/2024 M. Kelly Malva Diesing, PT, DPT Physical Therapist- [facility] Office Number: [phone]         "

## 2024-10-21 ENCOUNTER — Ambulatory Visit

## 2024-10-21 DIAGNOSIS — R2681 Unsteadiness on feet: Secondary | ICD-10-CM

## 2024-10-21 DIAGNOSIS — R471 Dysarthria and anarthria: Secondary | ICD-10-CM

## 2024-10-21 DIAGNOSIS — R2689 Other abnormalities of gait and mobility: Secondary | ICD-10-CM

## 2024-10-21 DIAGNOSIS — R262 Difficulty in walking, not elsewhere classified: Secondary | ICD-10-CM

## 2024-10-21 DIAGNOSIS — R293 Abnormal posture: Secondary | ICD-10-CM

## 2024-10-21 DIAGNOSIS — M6281 Muscle weakness (generalized): Secondary | ICD-10-CM

## 2024-10-21 NOTE — Therapy (Signed)
 " OUTPATIENT PHYSICAL THERAPY NEURO TREATMENT NOTE   Patient Name: [name] MRN: [id number] DOB:07-22-1956, 69 y.o., male Today's Date: 10/21/2024   PCP: Leonel Cole, MD REFERRING PROVIDER: Evonnie Asberry RAMAN, DO  END OF SESSION:  PT End of Session - 10/21/24 1407     Visit Number 6    Number of Visits 12    Date for Recertification  11/04/24    Authorization Type [employer]    Authorization Time Period **Auth#: [id number], approved 7 PT visits from 09/23/2024 - 12/16/2024.**    Authorization - Visit Number 6    Authorization - Number of Visits 7    Progress Note Due on Visit 10    PT Start Time 1400    PT Stop Time 1445    PT Time Calculation (min) 45 min    Equipment Utilized During Treatment Gait belt    Activity Tolerance Patient tolerated treatment well    Behavior During Therapy WFL for tasks assessed/performed            Past Medical History:  Diagnosis Date   Allergy    Asthma    Erectile dysfunction    Hypertension    Hypopotassemia    Impotence of organic origin    Inguinal hernia without mention of obstruction or gangrene, unilateral or unspecified, (not specified as recurrent)    Pure hypercholesterolemia    Spermatocele    Urinary reflux    as child     Past Surgical History:  Procedure Laterality Date   ESOPHAGEAL DILATION  03   HERNIA REPAIR     INGUINAL HERNIA REPAIR  10/10/2012   Procedure: LAPAROSCOPIC INGUINAL HERNIA;  Surgeon: Redell Faith, DO;  Location: [facility] OR;  Service: General;  Laterality: Right;   INSERTION OF MESH  10/10/2012   Procedure: INSERTION OF MESH;  Surgeon: Redell Faith, DO;  Location: [facility] OR;  Service: General;  Laterality: Right;   UPPER GI ENDOSCOPY  03   for food caught in thoat   There are no active problems to display for this patient.   ONSET DATE:   REFERRING DIAG:  G20.A1 (ICD-10-CM) - Parkinson's disease without dyskinesia or fluctuating manifestations (HCC)    THERAPY DIAG:  Unsteadiness  on feet  Other abnormalities of gait and mobility  Muscle weakness (generalized)  Difficulty in walking, not elsewhere classified  Abnormal posture  Rationale for Evaluation and Treatment: Rehabilitation  SUBJECTIVE:  SUBJECTIVE STATEMENT: Doing well, not too sore after last session, low back is sore but not worse Pt accompanied by: self  PERTINENT HISTORY:  L THR and lumbar surgery about 5 years ago PAIN:  Are you having pain? Yes: NPRS scale: 5/10 Pain location: low back Pain description: pinch Aggravating factors: sit to stand Relieving factors: rest  PRECAUTIONS: Fall  RED FLAGS: None   WEIGHT BEARING RESTRICTIONS: No  FALLS: Has patient fallen in last 6 months? Yes. Number of falls 4  LIVING ENVIRONMENT: Lives with: lives with an adult companion Lives in: House/apartment Stairs: Yes: Internal: 14 steps; can reach both and External: 3 steps; on right going up Has following equipment at home: Walker - 2 wheeled, quad cane,   PLOF: Independent with basic ADLs  PATIENT GOALS: improve balance/mobility/walking  OBJECTIVE:   TODAY'S TREATMENT: 10/21/24 Activity Comments  NU-step level 3 x 8 min Steady state at 80-100 SPM  Postural re-education Static and with multi-tasking. Difficulty maintaining extension with divided attention  Standing PWR moves 1x10 slow rehearsal, frequent cues for position/performance 1x10 with increased speed and decreased cues to occasional  LE stretching Gastroc 2x60 slantboard           TODAY'S TREATMENT: 10/17/24 Activity Comments  NU-step speed intervals level 5 x 8 min 2 min warm-up 30 sec sprint; 60 sec recovery  Seated PWR moves 2x10   Standing PWR moves -video instruction -1x10 review, imbalance with change of direction movements  Dynamic  standing balance   Static multisensory balance EO/EC on firm/foam Postural perturbations on firm/foam Standing on slope x 30 sec EO/EC        TODAY'S TREATMENT: 10/15/24 Activity Comments  NU-step level 5 x 6 min Maintains 100 SPM  Postural re-education -standing back to door frame and working on trunk extension with various activities for upper body strength to divide attention and improve ability to sustain upright posture against perturbations  Dynamic standing balance To promote large amplitude movement and single limb support  Static multisensory balance  EO/EC on firm/foam Postural perturbations on firm/foam Standing on slope x 30 sec EO/EC  Gastroc stretch 2x60 sec On slantboard to increase ankle DF for standing balance on sloped surface            TODAY'S TREATMENT: 10/10/2024 Activity Comments  NuStep, Level 3>4, 4 extremities x 8 minutes For aerobic warm up, >100 SPM  Review of HEP Good return demo with min verbal cues for technique (with PWR! Twist)  FTSTS:  13.38 sec Improved from 28 sec  Sit to stand from low surface simulating sofa Multiple reps with cues scooting, foot placement, use of momentum to stand  Stand at counter with minisquat to up on toes x 10 Wide BOS lateral weightshifting Side step and weightshift x 10   Gait 50 ft x 2, then 100 ft x 2 with RW Scissoring gait at times, supervision      Access Code: [email or web address] URL: [email or web address] Date: 09/29/2024 Prepared by: [facility] - [facility]  Exercises - Sit to Stand  - 1 x daily - 7 x weekly - 3 sets - 5 reps SEATED PWR! MOVES, 10 reps, once/day - Stretching the Involved Hip and Ankle in Doorframe Standing in Stride  - 30-60 sec hold  PATIENT EDUCATION: Education details: Began discussion about community PD exercise with patient-he currently does not have information about options; practiced sit to stand from low surface, simulating sofa (as pt describes  this is difficult) Person  educated: Patient Education method: Explanation, Demonstration, Tactile cues, and Verbal cues Education comprehension: verbalized understanding, returned demonstration, verbal cues required, and needs further education  --------------------------------------- Note: Objective measures were completed at Evaluation unless otherwise noted.  DIAGNOSTIC FINDINGS:   COGNITION: Overall cognitive status: Within functional limits for tasks assessed   SENSATION: Not tested  COORDINATION: Deficits to rapid alternating movement L > R Heel to shin: incr difficulty w/ LLE    MUSCLE TONE: increased tone noted LUE/LLE    POSTURE: rounded shoulders, forward head, and flexed trunk   LOWER EXTREMITY ROM:     Active  Right Eval Left Eval  Hip flexion 110 100  Hip extension    Hip abduction    Hip adduction    Hip internal rotation    Hip external rotation    Knee flexion 120 120  Knee extension -5 -5  Ankle dorsiflexion 12 10  Ankle plantarflexion    Ankle inversion    Ankle eversion     (Blank rows = not tested)  LOWER EXTREMITY MMT:    BLE 4/5 to seated resisted tests  BED MOBILITY:  Not tested--reports independence  TRANSFERS: Sit to stand: instances retropulsion--pushes back of knees to chair/table   CURB:  Findings: SBA w/ RW  STAIRS: BHR w/ modified indep GAIT: Findings: Gait Characteristics: festinating and Comments: freezing in turns--tendency to leave foot on ground and pivot-slide around  FUNCTIONAL TESTS:  5 times sit to stand: 28 sec 10 meter walk test: 17 sec = 1.9 ft/sec Mini-BESTest: 16/28 TUG test: regular 13 sec; cognitive 17 sec (stops counting) w/ RW     PATIENT SURVEYS:  Freezing of gait questionnaire TBD                                                                                                                              TREATMENT DATE: 09/24/24    PATIENT EDUCATION: Education details: assessment  details, rationale of PT intervention in regards to PD Person educated: Patient Education method: Explanation Education comprehension: verbalized understanding  HOME EXERCISE PROGRAM: TBD  GOALS: Goals reviewed with patient? Yes  SHORT TERM GOALS: Target date: 10/15/2024    Patient will be independent in HEP to improve functional outcomes Baseline: Goal status: MET, 10/10/2024  2.  Demo improved BLE strength and control with low risk for falls per time 15 sec 5xSTS test Baseline: 28 sec w/ compensation Goal status: MET, 10/10/2024   LONG TERM GOALS: Target date: 11/05/2024    Teach back relevant PD programs in community for exercise participation Baseline:  Goal status: IN PROGRESS  2.  Teach back strategies to address freezing of gait Baseline:  Goal status: IN PROGRESS  3.  Improve balance and reduce risk for falls per score 22/28 Mini-BESTest Baseline: 16/28 Goal status: IN PROGRESS  4.  Improve gait speed to 2.8 ft/sec to improve efficiency of community ambulation Baseline: 1.9 ft/sec w/ RW Goal status: IN PROGRESS  ASSESSMENT:  CLINICAL IMPRESSION: NU-step steady state for speed to improve coordination for rapid alternating movements.  Postural re-education review with door frame to provide physical contact for context to improve trunk/neck extension with improved ability to maintain statically with occasional verbal cues for physical contact and introduced multi-tasking to this with decreased postural control with divided attention progressively forward flexing with time/effort.  Standing large amplitude movements to improve coordination, flexibility and motor control to improve ambulation and mobility advanced with these to increase speed and tapered feedback to allow self-reflectoin and correction Stretching to improve flexibility and decrease rigidity  EVAL:  Patient is a 69 y.o. male who was seen today for physical therapy evaluation and treatment for  G20.A1 (ICD-10-CM) - Parkinson's disease without dyskinesia or fluctuating manifestations (HCC).  Exhibits symptoms of bradykinesia, postural dysfunction, high risk for falls per outcome measures, gait deviations and deficits with freezing of gait, chronic LBP, decreased gait speed, and increased need for assistance for functional mobility and ADL.  Pt would benefit from PT services to address deficits and limitations and provide relevant education for community resources.    OBJECTIVE IMPAIRMENTS: Abnormal gait, decreased activity tolerance, decreased balance, decreased coordination, decreased endurance, decreased knowledge of condition, decreased mobility, difficulty walking, decreased ROM, decreased strength, impaired tone, postural dysfunction, and pain.   ACTIVITY LIMITATIONS: carrying, lifting, bending, standing, stairs, transfers, and locomotion level  PARTICIPATION LIMITATIONS: meal prep, cleaning, laundry, interpersonal relationship, driving, shopping, community activity, and exercise routine  PERSONAL FACTORS: Age, Time since onset of injury/illness/exacerbation, and 1-2 comorbidities: PMH including hx of lumbar spine sx, PD, HTN are also affecting patient's functional outcome.   REHAB POTENTIAL: Good  CLINICAL DECISION MAKING: Evolving/moderate complexity  EVALUATION COMPLEXITY: Moderate  PLAN:  PT FREQUENCY: 1-2x/week  PT DURATION: 6 weeks  PLANNED INTERVENTIONS: 97750- Physical Performance Testing, 97110-Therapeutic exercises, 97530- Therapeutic activity, [id number]- Neuromuscular re-education, 97535- Self Care, [id number]- Manual therapy, [id number]- Gait training, [phone]- Canalith repositioning, [id number]- Aquatic Therapy, [id number]- Electrical stimulation (unattended), and Patient/Family education  PLAN FOR NEXT SESSION: postural re-ed for upright/unsupported stand, lower extremity strengthening and balance; standing PWR! Moves   2:08 PM, 10/21/2024 M. Kelly Shree Espey, PT, DPT Physical Therapist-  [place] Office Number: [phone]         "

## 2024-10-21 NOTE — Therapy (Signed)
 " OUTPATIENT SPEECH LANGUAGE PATHOLOGY PARKINSON'S TREATMENT   Patient Name: [name] MRN: [id number] DOB:December 03, 1955, 69 y.o., male Today's Date: 10/21/2024  PCP: Leonel Cole, MD REFERRING PROVIDER: Evonnie Stabs, DO  END OF SESSION:  End of Session - 10/21/24 1532     Visit Number 6    Number of Visits 9    Date for Recertification  10/31/24    SLP Start Time 1453    SLP Stop Time  1527    SLP Time Calculation (min) 34 min    Activity Tolerance Patient tolerated treatment well             Past Medical History:  Diagnosis Date   Allergy    Asthma    Erectile dysfunction    Hypertension    Hypopotassemia    Impotence of organic origin    Inguinal hernia without mention of obstruction or gangrene, unilateral or unspecified, (not specified as recurrent)    Pure hypercholesterolemia    Spermatocele    Urinary reflux    as child     Past Surgical History:  Procedure Laterality Date   ESOPHAGEAL DILATION  03   HERNIA REPAIR     INGUINAL HERNIA REPAIR  10/10/2012   Procedure: LAPAROSCOPIC INGUINAL HERNIA;  Surgeon: Redell Faith, DO;  Location: [facility] OR;  Service: General;  Laterality: Right;   INSERTION OF MESH  10/10/2012   Procedure: INSERTION OF MESH;  Surgeon: Redell Faith, DO;  Location: [facility] OR;  Service: General;  Laterality: Right;   UPPER GI ENDOSCOPY  03   for food caught in thoat   There are no active problems to display for this patient.   ONSET DATE: script 08/28/24  REFERRING DIAG: Parkinson's disease  THERAPY DIAG:  Dysarthria and anarthria  Rationale for Evaluation and Treatment: Rehabilitation  SUBJECTIVE:   SUBJECTIVE STATEMENT: Pt told SLP he has been practicing /a/, glides, and m-words since last session.  Pt accompanied by: self  PERTINENT HISTORY: see above  PAIN:  Are you having pain? Yes: NPRS scale: 6/10 Pain location: lower back Pain description: pinch, soreness Aggravating factors: sit to stand Relieving factors:  rest  FALLS: Has patient fallen in last 6 months?  Yes, See PT evaluation for details   PATIENT GOALS: Improve communication  OBJECTIVE:  Note: Objective measures were completed at Evaluation unless otherwise noted.  DIAGNOSTIC FINDINGS:  Assessment/Plan:  TAT- 08/28/24  1.  Parkinsons Disease             - Patient looks significantly worse than last visit (he looks like he did prior to starting medication for Parkinson's).  This is likely because he is not taking medication as directed.             - Discussed importance of compliance with medication and follow-up visits.  He is reporting he takes medication faithfully but pramipexole /primidone  hasn't been filled since 2023 and 2024.  Even after talking about this, he still reports that he has been taking it faithfully.             - restart carbidopa /levodopa  25/100, 1 po tid.  He is currently only taking it once per day.             -may need to restart pramipexole  but we will hold on restarting that for now.             - I asked him to bring me his bottles next visit             -  Referred to physical and speech therapy             - Discussed Parkinson's disease in detail and the things that it can affect.  Friend was with him today and stated that he did not even know he had Parkinson's disease, even though he delivers his medication to him.             -he has quit drinking alcohol all together and both patient/friend agree that he has not been drinking alcohol.  Proud of him for that.             - Talked to the patient extensively about his sleep/wake schedule.  I told him he needed to be up and out of the bed by 10 AM.  2.  LBP - Following with [facility]   3.  F/u 6 months   Subjective:    [name] was seen today in follow up.  Pt with friend, [name], who supplements hx.  Patient has not been seen in a year because of canceled appointments.  His primary care physician has been refilling his  levodopa .  He is no longer on the pramipexole .  Dispense reports indicate that this was last filled April, 2024 for a 42-month supply (although patient told me he was on it when I saw him last October).  Notes from primary care indicate patient had stopped his levodopa  for several months, but then his condition worsened and wanted to go back on medication and his primary care physician told him to follow-up here.  Pt states that he is only taking 1 levodopa  per day.  He reports he is still on the pramipexole , although last filled 01/2023.  He also states that he is on primidone .  We stopped that previously and it was last filled 07/2022.     Current movement disorder medications: Carbidopa /levodopa  25/100, 1 tablet 3 times per day (6am/11:30pm/5pm) Pramipexole  0.5 mg 3 times per day.     Prior medications: Primidone ; pramipexole   MR BRAIN WO CONT 08/07/22 IMPRESSION: 1. No acute intracranial abnormality. 2. 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    PATIENT REPORTED OUTCOME MEASURES (PROM): Communication Effectiveness Survey: provided 10/15/24 . Pt scored 21/32 indicating that there is some reduction in effectiveness of his speech/communication, especially in situations with a noisy environment, when emotionally upset, or when talking across a room.                                                                                                                            TREATMENT DATE:   Abdominal breathing=AB, SO=SpeakOut!, CES = Communication Effectiveness Survey  10/21/24: Maybe I wont' need the printout (of the vocal exercises). SLP targeted volume and intelligibility using Speak Out! components with the following dB averages: M-words: 87 (model consistently needed for first two reps to slow pt's production), Sustained AH: 88, Glides: 88 with consistent simultaneous SLP production for  procedure, Counting: 87 with usual simultaneous production with SLP  for slowing down. In multi-sentence responses pt maintained WNL volume, SLP TO TARGET IN CONVERSATIONAL SEGMENTS NEXT SESSIoN.  10/17/24: Pt cont to practice.He (roommate) is saying I can understand you better. [name] attributes this to looking at the listener, and speaking with better volume as he thinks about speaking out.  SLP targeted volume and intelligibility using Speak Out! components with the following dB averages: M-words: 86, Sustained AH: 86, Glides: 80, Counting: 84dB. Pt required consistent model by SLP faded to occasional mod verbal cues forall exercises to incr'd intent to slow his rate (m-words and numbers), for holding pitches, and upward glide and downward glides instead of up and then back down again. In sentence tasks SLP provided initial cues for intent and he was successful with WNL volume 90% of the time.   10/15/24: Given pt's s statement SLP congratulated him on speaking out, outside of ST. SLP guided pt through CES with above score. SLP targeted volume and intelligibility using Speak Out! components with the following dB averages: M-words: 85, Sustained AH: 82, Glides: 80, Counting: 85 dB. Pt required consistent model by SLP faded to occasional min-mod verbal cues for m-words, initial cues for /a/ for volume, initial modeling by SLP for glides faded to usual mod A, and modeling faded to occasional mod A for numbers. SLP told pt to cont using intentional and purposeful speech in conversation.   10/08/24: Pt needs PROM next session. SLP used SO to improve pt's level of intent when talking, and speech clarity. Pt required consistent faded to occasional modeling, and consistent faded to occasional min verbal cues for volume and breath support. Pt utilized SO approx 70% of productions today. SLP provided pt homework related to SO. Today SLP introduced pt to s/sx swallowing difficulty. He told SLP some of these with mod I.   10/02/24: SLP initiated SPEAK OUT! Program.  Targeted volume  and intelligibility using Speak Out! Lesson 1 and 2. Pt required frequent min verbal cues, modeling, for volume and breath support.   Pt averages the following volume levels:  Sustained AH: 82 dB  Counting:85 dB  Reading (phrases): 82 dB  Cognitive Exercise: 74 dB Required frequent min verbal cues, modeling for carryover of intent /volume answering simple questions following structured practice. SLP provided pt homework related to SPEAKOUT! Program. Plan is to get pt formal HEP for SO! to promote generalization of speaking with intent. In addition, plan is to continue SO! Program for optimizing speech production for QOL.  09/23/24: n/a   PATIENT EDUCATION: Education details: see treatment date Person educated: Patient Education method: Explanation Education comprehension: verbalized understanding  HOME EXERCISE PROGRAM: Eventually, AB and phonatory effort tasks   GOALS: Goals reviewed with patient? Yes  SHORT TERM GOALS: =  LONG TERM GOALS: Target date: 10/31/24  Pt will demo HEP with mod I in 2 sessions Baseline:  Goal status: ONGOING  2.  Pt will improve PROM Baseline:  Goal status: ONGOING  3.  Pt will tell SLP 3 common overt s/sx swallowing difficulties in PD with mod I Baseline:  Goal status: met  4.  Pt will maintain WNL volume and AB 70% in 15 minutes conversation in 2 sessions  Baseline:  Goal status: ONGOING  5.  Pt and/or roommate will tell SLP how to maximize conversational effectiveness Baseline:  Goal status: ONGOING    ASSESSMENT:  CLINICAL IMPRESSION: Patient is a 69 y.o. M who was seen today for treatment of  communication in light of PD. See treatment date above for today's date for further details on today's session.  Pt's volume on day of eval was WNL (70dB) but after 17 minutes of min-mod complex conversation became more variable in nature with average 69dB. He would benefit from a short course of ST to educate him about maximizing  conversational effectiveness as he states his roommate tells him that he is difficult to understand, initiating some exercises for maintaining strong speaking voice, as well as learning how to engage abdominal musculature when speaking (pt was breathing with thoracic musculature). Today pt was 100% intelligible but used abdominal breathing only approx 15% of the time.   OBJECTIVE IMPAIRMENTS: Objective impairments include dysarthria. These impairments are limiting patient from ADLs/IADLs and effectively communicating at home and in community.Factors affecting potential to achieve goals and functional outcome are family/community support.. Patient will benefit from skilled SLP services to address above impairments and improve overall function.  REHAB POTENTIAL: Good  PLAN:  SLP FREQUENCY: 2x/week  SLP DURATION: 4 weeks  PLANNED INTERVENTIONS: Functional tasks, SLP instruction and feedback, Compensatory strategies, Patient/family education, [phone] Treatment of speech (30 or 45 min) , and speech exercises     10/21/2024, 3:34 PM  .[email or web address]    "

## 2024-10-23 ENCOUNTER — Ambulatory Visit

## 2024-10-23 DIAGNOSIS — R471 Dysarthria and anarthria: Secondary | ICD-10-CM

## 2024-10-23 DIAGNOSIS — R293 Abnormal posture: Secondary | ICD-10-CM

## 2024-10-23 DIAGNOSIS — R2681 Unsteadiness on feet: Secondary | ICD-10-CM

## 2024-10-23 DIAGNOSIS — R2689 Other abnormalities of gait and mobility: Secondary | ICD-10-CM

## 2024-10-23 DIAGNOSIS — R262 Difficulty in walking, not elsewhere classified: Secondary | ICD-10-CM

## 2024-10-23 DIAGNOSIS — M6281 Muscle weakness (generalized): Secondary | ICD-10-CM

## 2024-10-23 NOTE — Therapy (Signed)
 " OUTPATIENT SPEECH LANGUAGE PATHOLOGY PARKINSON'S TREATMENT/DISCHARGE   Patient Name: [name] MRN: [id number] DOB:09-Dec-1955, 69 y.o., male Today's Date: 10/23/2024  PCP: Leonel Cole, MD REFERRING PROVIDER: Evonnie Stabs, DO  END OF SESSION:  End of Session - 10/23/24 1403     Visit Number 7    Number of Visits 9    Date for Recertification  10/31/24    SLP Start Time 1403    SLP Stop Time  1445    SLP Time Calculation (min) 42 min    Activity Tolerance Patient tolerated treatment well             Past Medical History:  Diagnosis Date   Allergy    Asthma    Erectile dysfunction    Hypertension    Hypopotassemia    Impotence of organic origin    Inguinal hernia without mention of obstruction or gangrene, unilateral or unspecified, (not specified as recurrent)    Pure hypercholesterolemia    Spermatocele    Urinary reflux    as child     Past Surgical History:  Procedure Laterality Date   ESOPHAGEAL DILATION  03   HERNIA REPAIR     INGUINAL HERNIA REPAIR  10/10/2012   Procedure: LAPAROSCOPIC INGUINAL HERNIA;  Surgeon: Redell Faith, DO;  Location: [facility] OR;  Service: General;  Laterality: Right;   INSERTION OF MESH  10/10/2012   Procedure: INSERTION OF MESH;  Surgeon: Redell Faith, DO;  Location: [facility] OR;  Service: General;  Laterality: Right;   UPPER GI ENDOSCOPY  03   for food caught in thoat   There are no active problems to display for this patient.  SPEECH THERAPY DISCHARGE SUMMARY  Visits from Start of Care: 7  Current functional level related to goals / functional outcomes: Pt partially met some goals; He was very successful at maintaining WNL volume over time and told SLP that his roommate had commented positively on his speech output since he began ST.   Remaining deficits: Some reduced speech volume in very long conversational discourse or when markedly fatigued.    Education / Equipment: See therapy session notes.   Patient agrees to  discharge. Patient goals were partially met. Patient is being discharged due to being pleased with the current functional level..     ONSET DATE: script 08/28/24  REFERRING DIAG: Parkinson's disease  THERAPY DIAG:  Dysarthria and anarthria  Rationale for Evaluation and Treatment: Rehabilitation  SUBJECTIVE:   SUBJECTIVE STATEMENT: Pt again told SLP he has been practicing /a/, glides, and m-words since last session.  Pt accompanied by: self  PERTINENT HISTORY: see above  PAIN:  Are you having pain? Yes: NPRS scale: 5/10 Pain location: lower back Pain description: pinch, soreness Aggravating factors: sit to stand Relieving factors: rest  FALLS: Has patient fallen in last 6 months?  Yes, See PT evaluation for details   PATIENT GOALS: Improve communication  OBJECTIVE:  Note: Objective measures were completed at Evaluation unless otherwise noted.  DIAGNOSTIC FINDINGS:  Assessment/Plan:  TAT- 08/28/24  1.  Parkinsons Disease             - Patient looks significantly worse than last visit (he looks like he did prior to starting medication for Parkinson's).  This is likely because he is not taking medication as directed.             - Discussed importance of compliance with medication and follow-up visits.  He is reporting he takes medication faithfully but pramipexole /primidone  hasn't  been filled since 2023 and 2024.  Even after talking about this, he still reports that he has been taking it faithfully.             - restart carbidopa /levodopa  25/100, 1 po tid.  He is currently only taking it once per day.             -may need to restart pramipexole  but we will hold on restarting that for now.             - I asked him to bring me his bottles next visit             - Referred to physical and speech therapy             - Discussed Parkinson's disease in detail and the things that it can affect.  Friend was with him today and stated that he did not even know he had Parkinson's  disease, even though he delivers his medication to him.             -he has quit drinking alcohol all together and both patient/friend agree that he has not been drinking alcohol.  Proud of him for that.             - Talked to the patient extensively about his sleep/wake schedule.  I told him he needed to be up and out of the bed by 10 AM.  2.  LBP - Following with [facility]   3.  F/u 6 months   Subjective:    [name] was seen today in follow up.  Pt with friend, [name], who supplements hx.  Patient has not been seen in a year because of canceled appointments.  His primary care physician has been refilling his levodopa .  He is no longer on the pramipexole .  Dispense reports indicate that this was last filled April, 2024 for a 24-month supply (although patient told me he was on it when I saw him last October).  Notes from primary care indicate patient had stopped his levodopa  for several months, but then his condition worsened and wanted to go back on medication and his primary care physician told him to follow-up here.  Pt states that he is only taking 1 levodopa  per day.  He reports he is still on the pramipexole , although last filled 01/2023.  He also states that he is on primidone .  We stopped that previously and it was last filled 07/2022.     Current movement disorder medications: Carbidopa /levodopa  25/100, 1 tablet 3 times per day (6am/11:30pm/5pm) Pramipexole  0.5 mg 3 times per day.     Prior medications: Primidone ; pramipexole   MR BRAIN WO CONT 08/07/22 IMPRESSION: 1. No acute intracranial abnormality. 2. 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    PATIENT REPORTED OUTCOME MEASURES (PROM): Communication Effectiveness Survey: provided 10/15/24 . Pt scored 21/32 indicating that there is some reduction in effectiveness of his speech/communication, especially in situations with a noisy environment, when emotionally  upset, or when talking across a room.  TREATMENT DATE:   Abdominal breathing=AB, SO=SpeakOut!, CES = Communication Effectiveness Survey  10/23/24: Pt is satisfied with today being his last day of ST. SLP targeted volume and intelligibility using Speak Out! components with the following dB averages: M-words: 88 (model occasionally needed in first two reps to slow pt's production), Sustained AH: 87, Glides: 85 with usual simultaneous SLP production for procedure, Counting: 87. In conversation pt maintained WNL volume over 8 minutes in session and outdoors pt maintained a volume audible over car noise and other people talking along the sidewalk. Pt thanked SLP for helping me making my talking better. SLP encouraged pt to schedule a screening in approx 6 months.   10/21/24: Maybe I won't need the printout (of the vocal exercises). SLP targeted volume and intelligibility using Speak Out! components with the following dB averages: M-words: 87 (model consistently needed for first two reps to slow pt's production), Sustained AH: 88, Glides: 88 with consistent simultaneous SLP production for procedure, Counting: 87 with usual simultaneous production with SLP for slowing down. In multi-sentence responses pt maintained WNL volume, SLP TO TARGET IN CONVERSATIONAL SEGMENTS NEXT SESSION.  10/17/24: Pt cont to practice.He (roommate) is saying I can understand you better. Amor attributes this to looking at the listener, and speaking with better volume as he thinks about speaking out.  SLP targeted volume and intelligibility using Speak Out! components with the following dB averages: M-words: 86, Sustained AH: 86, Glides: 80, Counting: 84dB. Pt required consistent model by SLP faded to occasional mod verbal cues forall exercises to incr'd intent to slow his rate (m-words and numbers), for holding  pitches, and upward glide and downward glides instead of up and then back down again. In sentence tasks SLP provided initial cues for intent and he was successful with WNL volume 90% of the time.   10/15/24: Given pt's s statement SLP congratulated him on speaking out, outside of ST. SLP guided pt through CES with above score. SLP targeted volume and intelligibility using Speak Out! components with the following dB averages: M-words: 85, Sustained AH: 82, Glides: 80, Counting: 85 dB. Pt required consistent model by SLP faded to occasional min-mod verbal cues for m-words, initial cues for /a/ for volume, initial modeling by SLP for glides faded to usual mod A, and modeling faded to occasional mod A for numbers. SLP told pt to cont using intentional and purposeful speech in conversation.   10/08/24: Pt needs PROM next session. SLP used SO to improve pt's level of intent when talking, and speech clarity. Pt required consistent faded to occasional modeling, and consistent faded to occasional min verbal cues for volume and breath support. Pt utilized SO approx 70% of productions today. SLP provided pt homework related to SO. Today SLP introduced pt to s/sx swallowing difficulty. He told SLP some of these with mod I.   10/02/24: SLP initiated SPEAK OUT! Program.  Targeted volume and intelligibility using Speak Out! Lesson 1 and 2. Pt required frequent min verbal cues, modeling, for volume and breath support.   Pt averages the following volume levels:  Sustained AH: 82 dB  Counting:85 dB  Reading (phrases): 82 dB  Cognitive Exercise: 74 dB Required frequent min verbal cues, modeling for carryover of intent /volume answering simple questions following structured practice. SLP provided pt homework related to SPEAKOUT! Program. Plan is to get pt formal HEP for SO! to promote generalization of speaking with intent. In addition, plan is to continue SO! Program for optimizing speech production for  QOL.  09/23/24: n/a   PATIENT EDUCATION: Education details: see treatment date Person educated: Patient Education method: Explanation Education comprehension: verbalized understanding  HOME EXERCISE PROGRAM: Eventually, AB and phonatory effort tasks   GOALS: Goals reviewed with patient? Yes  SHORT TERM GOALS: =  LONG TERM GOALS: Target date: 10/31/24  Pt will demo HEP with mod I in 2 sessions Baseline:  Goal status: not met  2.  Pt will improve PROM Baseline:  Goal status: pt left prior to administration  3.  Pt will tell SLP 3 common overt s/sx swallowing difficulties in PD with mod I Baseline:  Goal status: met  4.  Pt will maintain WNL volume and AB 70% in 15 minutes conversation in 2 sessions  Baseline:  Goal status: partially met  5.  Pt and/or roommate will tell SLP how to maximize conversational effectiveness Baseline:  Goal status: partially met    ASSESSMENT:  CLINICAL IMPRESSION: Patient is a 69 y.o. M who was seen today for treatment of communication in light of PD. See treatment date above for today's date for further details on today's session. Pt was eager for d/c today. Pt's volume on day of eval was WNL (70dB) but after 17 minutes of min-mod complex conversation became more variable in nature with average 69dB. He would benefit from a short course of ST to educate him about maximizing conversational effectiveness as he states his roommate tells him that he is difficult to understand, initiating some exercises for maintaining strong speaking voice, as well as learning how to engage abdominal musculature when speaking (pt was breathing with thoracic musculature). Today pt was 100% intelligible but used abdominal breathing only approx 15% of the time.   OBJECTIVE IMPAIRMENTS: Objective impairments include dysarthria. These impairments are limiting patient from ADLs/IADLs and effectively communicating at home and in community.Factors affecting  potential to achieve goals and functional outcome are family/community support.. Patient will benefit from skilled SLP services to address above impairments and improve overall function.  REHAB POTENTIAL: Good  PLAN:  SLP FREQUENCY: 2x/week  SLP DURATION: 4 weeks  PLANNED INTERVENTIONS: Functional tasks, SLP instruction and feedback, Compensatory strategies, Patient/family education, [phone] Treatment of speech (30 or 45 min) , and speech exercises     10/23/2024, 2:04 PM  .[email or web address]    "

## 2024-10-23 NOTE — Therapy (Signed)
 " OUTPATIENT PHYSICAL THERAPY NEURO TREATMENT NOTE and D/C Summary   Patient Name: [name] MRN: [id number] DOB:[birth year], 69 y.o., male Today's Date: 10/23/2024   PCP: Leonel Cole, MD REFERRING PROVIDER: Tat, Asberry RAMAN, DO  PHYSICAL THERAPY DISCHARGE SUMMARY  Visits from Start of Care: 7  Current functional level related to goals / functional outcomes: See below for outcome measures.     Remaining deficits: Bradykinesia, increased rigidity, high risk for falls   Education / Equipment: HEP and community-based exercise class groups   Patient agrees to discharge. Patient goals were partially met. Patient is being discharged due to financial reasons.   END OF SESSION:  PT End of Session - 10/23/24 1451     Visit Number 7    Number of Visits 12    Date for Recertification  11/04/24    Authorization Type [employer]    Authorization Time Period **Auth#: [id number], approved 7 PT visits from 09/23/2024 - 12/16/2024.**    Authorization - Visit Number 7    Authorization - Number of Visits 7    Progress Note Due on Visit 10    PT Start Time 1445    PT Stop Time 1530    PT Time Calculation (min) 45 min    Equipment Utilized During Treatment Gait belt    Activity Tolerance Patient tolerated treatment well    Behavior During Therapy WFL for tasks assessed/performed            Past Medical History:  Diagnosis Date   Allergy    Asthma    Erectile dysfunction    Hypertension    Hypopotassemia    Impotence of organic origin    Inguinal hernia without mention of obstruction or gangrene, unilateral or unspecified, (not specified as recurrent)    Pure hypercholesterolemia    Spermatocele    Urinary reflux    as child     Past Surgical History:  Procedure Laterality Date   ESOPHAGEAL DILATION  03   HERNIA REPAIR     INGUINAL HERNIA REPAIR  10/10/2012   Procedure: LAPAROSCOPIC INGUINAL HERNIA;  Surgeon: Redell Faith, DO;  Location: [facility] OR;  Service:  General;  Laterality: Right;   INSERTION OF MESH  10/10/2012   Procedure: INSERTION OF MESH;  Surgeon: Redell Faith, DO;  Location: [facility] OR;  Service: General;  Laterality: Right;   UPPER GI ENDOSCOPY  03   for food caught in thoat   There are no active problems to display for this patient.   ONSET DATE:   REFERRING DIAG:  G20.A1 (ICD-10-CM) - Parkinson's disease without dyskinesia or fluctuating manifestations (HCC)    THERAPY DIAG:  Unsteadiness on feet  Other abnormalities of gait and mobility  Muscle weakness (generalized)  Difficulty in walking, not elsewhere classified  Abnormal posture  Rationale for Evaluation and Treatment: Rehabilitation  SUBJECTIVE:  SUBJECTIVE STATEMENT: Doing ok, today will be the last day due to increased co-pay Pt accompanied by: self  PERTINENT HISTORY:  L THR and lumbar surgery about 5 years ago PAIN:  Are you having pain? Yes: NPRS scale: 5/10 Pain location: low back Pain description: pinch Aggravating factors: sit to stand Relieving factors: rest  PRECAUTIONS: Fall  RED FLAGS: None   WEIGHT BEARING RESTRICTIONS: No  FALLS: Has patient fallen in last 6 months? Yes. Number of falls 4  LIVING ENVIRONMENT: Lives with: lives with an adult companion Lives in: House/apartment Stairs: Yes: Internal: 14 steps; can reach both and External: 3 steps; on right going up Has following equipment at home: Walker - 2 wheeled, quad cane,   PLOF: Independent with basic ADLs  PATIENT GOALS: improve balance/mobility/walking  OBJECTIVE:    TODAY'S TREATMENT: 10/23/24 Activity Comments  NU-step x 10 min Steady state 80-100 SPM with occasional cues in foot placement and symmetry  HEP review 100% recall  Mini-BESTest 19/28              [facility] PT Assessment  - 10/23/24 0001       Mini-BESTest   Sit To Stand Normal: Comes to stand without use of hands and stabilizes independently.    Rise to Toes Normal: Stable for 3 s with maximum height.    Stand on one leg (left) Moderate: < 20 s    Stand on one leg (right) Normal: 20 s.    Stand on one leg - lowest score 1    Compensatory Stepping Correction - Forward Moderate: More than one step is required to recover equilibrium    Compensatory Stepping Correction - Backward No step, OR would fall if not caught, OR falls spontaneously.    Compensatory Stepping Correction - Left Lateral Severe: Falls, or cannot step    Compensatory Stepping Correction - Right Lateral Moderate: Several steps to recover equilibrium    Stepping Corredtion Lateral - lowest score 0    Stance - Feet together, eyes open, firm surface  Normal: 30s    Stance - Feet together, eyes closed, foam surface  Normal: 30s    Incline - Eyes Closed Normal: Stands independently 30s and aligns with gravity    Change in Gait Speed Normal: Significantly changes walkling speed without imbalance    Walk with head turns - Horizontal Normal: performs head turns with no change in gait speed and good balance    Walk with pivot turns Moderate:Turns with feet close SLOW (>4 steps) with good balance.    Step over obstacles Normal: Able to step over box with minimal change of gait speed and with good balance.    Timed UP & GO with Dual Task Severe: Stops counting while walking OR stops walking while counting.   regular: 16.22 sec; cog: 22.15   Mini-BEST total score 19            Access Code: [email or web address] URL: [email or web address] Date: 09/29/2024 Prepared by: [facility] - Outpatient  Rehab - [facility]  Exercises - Sit to Stand  - 1 x daily - 7 x weekly - 3 sets - 5 reps SEATED PWR! MOVES, 10 reps, once/day - Stretching the Involved Hip and Ankle in Doorframe Standing in Stride  - 30-60 sec hold  PATIENT EDUCATION: Education  details: Began discussion about community PD exercise with patient-he currently does not have information about options; practiced sit to stand from low surface, simulating sofa (as pt describes this is difficult) Person educated: Patient  Education method: Explanation, Demonstration, Tactile cues, and Verbal cues Education comprehension: verbalized understanding, returned demonstration, verbal cues required, and needs further education  --------------------------------------- Note: Objective measures were completed at Evaluation unless otherwise noted.  DIAGNOSTIC FINDINGS:   COGNITION: Overall cognitive status: Within functional limits for tasks assessed   SENSATION: Not tested  COORDINATION: Deficits to rapid alternating movement L > R Heel to shin: incr difficulty w/ LLE    MUSCLE TONE: increased tone noted LUE/LLE    POSTURE: rounded shoulders, forward head, and flexed trunk   LOWER EXTREMITY ROM:     Active  Right Eval Left Eval  Hip flexion 110 100  Hip extension    Hip abduction    Hip adduction    Hip internal rotation    Hip external rotation    Knee flexion 120 120  Knee extension -5 -5  Ankle dorsiflexion 12 10  Ankle plantarflexion    Ankle inversion    Ankle eversion     (Blank rows = not tested)  LOWER EXTREMITY MMT:    BLE 4/5 to seated resisted tests  BED MOBILITY:  Not tested--reports independence  TRANSFERS: Sit to stand: instances retropulsion--pushes back of knees to chair/table   CURB:  Findings: SBA w/ RW  STAIRS: BHR w/ modified indep GAIT: Findings: Gait Characteristics: festinating and Comments: freezing in turns--tendency to leave foot on ground and pivot-slide around  FUNCTIONAL TESTS:  5 times sit to stand: 28 sec 10 meter walk test: 17 sec = 1.9 ft/sec Mini-BESTest: 16/28 TUG test: regular 13 sec; cognitive 17 sec (stops counting) w/ RW     PATIENT SURVEYS:  Freezing of gait questionnaire TBD                                                                                                                               TREATMENT DATE: 09/24/24    PATIENT EDUCATION: Education details: assessment details, rationale of PT intervention in regards to PD Person educated: Patient Education method: Explanation Education comprehension: verbalized understanding  HOME EXERCISE PROGRAM: TBD  GOALS: Goals reviewed with patient? Yes  SHORT TERM GOALS: Target date: 10/15/2024    Patient will be independent in HEP to improve functional outcomes Baseline: Goal status: MET, 10/10/2024  2.  Demo improved BLE strength and control with low risk for falls per time 15 sec 5xSTS test Baseline: 28 sec w/ compensation Goal status: MET, 10/10/2024   LONG TERM GOALS: Target date: 11/05/2024    Teach back relevant PD programs in community for exercise participation Baseline:  Goal status: MET  2.  Teach back strategies to address freezing of gait Baseline:  Goal status: PARTIALLY MET  3.  Improve balance and reduce risk for falls per score 22/28 Mini-BESTest Baseline: 16/28; 19/28 Goal status: NOT MET  4.  Improve gait speed to 2.8 ft/sec to improve efficiency of community ambulation Baseline: 1.9 ft/sec w/ RW; 2.8 ft/sec Goal status: MET    ASSESSMENT:  CLINICAL IMPRESSION: Pt reports increase in his insurance co-pay which is cost prohibitive at this time and requests D/C to HEP due to other obligations.  NU-step for warm-up and general exercise to prepare for testing activities.  Mini-BESTest performed in comparison to outset with improved score from 16 to 19/28 indicating high risk for falls, but exhibits improved postural control with static balance demands and improved gait speed and stability with dynamic gait demands using RW for mobility.  Righting reflexes/responses limited and non-functional forwards, backwards, and left lateral with response present, but delayed and dysfunctional to the point of  LOB/fall if unassisted.  Review of HEP with pt and demonstrates independence and provided reference materials for community-based exercise classes. Pt will D/C to HEP and community at this time  EVAL:  Patient is a 69 y.o. male who was seen today for physical therapy evaluation and treatment for G20.A1 (ICD-10-CM) - Parkinson's disease without dyskinesia or fluctuating manifestations (HCC).  Exhibits symptoms of bradykinesia, postural dysfunction, high risk for falls per outcome measures, gait deviations and deficits with freezing of gait, chronic LBP, decreased gait speed, and increased need for assistance for functional mobility and ADL.  Pt would benefit from PT services to address deficits and limitations and provide relevant education for community resources.    OBJECTIVE IMPAIRMENTS: Abnormal gait, decreased activity tolerance, decreased balance, decreased coordination, decreased endurance, decreased knowledge of condition, decreased mobility, difficulty walking, decreased ROM, decreased strength, impaired tone, postural dysfunction, and pain.   ACTIVITY LIMITATIONS: carrying, lifting, bending, standing, stairs, transfers, and locomotion level  PARTICIPATION LIMITATIONS: meal prep, cleaning, laundry, interpersonal relationship, driving, shopping, community activity, and exercise routine  PERSONAL FACTORS: Age, Time since onset of injury/illness/exacerbation, and 1-2 comorbidities: PMH including hx of lumbar spine sx, PD, HTN are also affecting patient's functional outcome.   REHAB POTENTIAL: Good  CLINICAL DECISION MAKING: Evolving/moderate complexity  EVALUATION COMPLEXITY: Moderate  PLAN:  PT FREQUENCY: 1-2x/week  PT DURATION: 6 weeks  PLANNED INTERVENTIONS: 97750- Physical Performance Testing, 97110-Therapeutic exercises, 97530- Therapeutic activity, [id number]- Neuromuscular re-education, 97535- Self Care, [id number]- Manual therapy, [id number]- Gait training, [phone]- Canalith repositioning, [id number]-  Aquatic Therapy, [id number]- Electrical stimulation (unattended), and Patient/Family education  PLAN FOR NEXT SESSION: D/C to HEP   2:52 PM, 10/23/2024 M. Kelly Torian Thoennes, PT, DPT Physical Therapist- [place] Office Number: [phone]         "

## 2024-11-21 ENCOUNTER — Other Ambulatory Visit: Payer: Self-pay | Admitting: Family Medicine

## 2024-11-21 DIAGNOSIS — I77810 Thoracic aortic ectasia: Secondary | ICD-10-CM

## 2025-03-03 ENCOUNTER — Ambulatory Visit: Admitting: Neurology

## 2025-06-25 ENCOUNTER — Ambulatory Visit: Admitting: Physical Therapy

## 2025-06-25 ENCOUNTER — Ambulatory Visit

## 2025-06-25 ENCOUNTER — Ambulatory Visit: Admitting: Occupational Therapy
